# Patient Record
Sex: Female | Born: 1935 | Race: White | Hispanic: No | Smoking: Never smoker
Health system: Southern US, Community
[De-identification: ages and names within clinical notes are randomized; demographics above are authoritative.]

## PROBLEM LIST (undated history)

## (undated) DIAGNOSIS — F0390 Unspecified dementia without behavioral disturbance: Secondary | ICD-10-CM

## (undated) DIAGNOSIS — I10 Essential (primary) hypertension: Secondary | ICD-10-CM

## (undated) DIAGNOSIS — E785 Hyperlipidemia, unspecified: Secondary | ICD-10-CM

---

## 2011-09-26 DIAGNOSIS — H43399 Other vitreous opacities, unspecified eye: Secondary | ICD-10-CM | POA: Diagnosis not present

## 2011-09-26 DIAGNOSIS — H35039 Hypertensive retinopathy, unspecified eye: Secondary | ICD-10-CM | POA: Diagnosis not present

## 2011-09-26 DIAGNOSIS — H2510 Age-related nuclear cataract, unspecified eye: Secondary | ICD-10-CM | POA: Diagnosis not present

## 2012-05-10 DIAGNOSIS — M899 Disorder of bone, unspecified: Secondary | ICD-10-CM | POA: Diagnosis not present

## 2012-05-10 DIAGNOSIS — E785 Hyperlipidemia, unspecified: Secondary | ICD-10-CM | POA: Diagnosis not present

## 2012-05-10 DIAGNOSIS — I10 Essential (primary) hypertension: Secondary | ICD-10-CM | POA: Diagnosis not present

## 2012-05-10 DIAGNOSIS — R82998 Other abnormal findings in urine: Secondary | ICD-10-CM | POA: Diagnosis not present

## 2012-05-10 DIAGNOSIS — M949 Disorder of cartilage, unspecified: Secondary | ICD-10-CM | POA: Diagnosis not present

## 2012-05-17 DIAGNOSIS — Z1331 Encounter for screening for depression: Secondary | ICD-10-CM | POA: Diagnosis not present

## 2012-05-17 DIAGNOSIS — R42 Dizziness and giddiness: Secondary | ICD-10-CM | POA: Diagnosis not present

## 2012-05-17 DIAGNOSIS — M899 Disorder of bone, unspecified: Secondary | ICD-10-CM | POA: Diagnosis not present

## 2012-05-17 DIAGNOSIS — M949 Disorder of cartilage, unspecified: Secondary | ICD-10-CM | POA: Diagnosis not present

## 2012-05-17 DIAGNOSIS — Z0000 Encounter for general adult medical examination without abnormal findings: Secondary | ICD-10-CM | POA: Diagnosis not present

## 2012-05-17 DIAGNOSIS — E785 Hyperlipidemia, unspecified: Secondary | ICD-10-CM | POA: Diagnosis not present

## 2012-05-17 DIAGNOSIS — I10 Essential (primary) hypertension: Secondary | ICD-10-CM | POA: Diagnosis not present

## 2013-05-15 DIAGNOSIS — R82998 Other abnormal findings in urine: Secondary | ICD-10-CM | POA: Diagnosis not present

## 2013-05-15 DIAGNOSIS — E785 Hyperlipidemia, unspecified: Secondary | ICD-10-CM | POA: Diagnosis not present

## 2013-05-15 DIAGNOSIS — I10 Essential (primary) hypertension: Secondary | ICD-10-CM | POA: Diagnosis not present

## 2013-05-15 DIAGNOSIS — M899 Disorder of bone, unspecified: Secondary | ICD-10-CM | POA: Diagnosis not present

## 2013-05-22 DIAGNOSIS — Z0000 Encounter for general adult medical examination without abnormal findings: Secondary | ICD-10-CM | POA: Diagnosis not present

## 2013-05-22 DIAGNOSIS — I10 Essential (primary) hypertension: Secondary | ICD-10-CM | POA: Diagnosis not present

## 2013-05-22 DIAGNOSIS — H04129 Dry eye syndrome of unspecified lacrimal gland: Secondary | ICD-10-CM | POA: Diagnosis not present

## 2013-05-22 DIAGNOSIS — Z1331 Encounter for screening for depression: Secondary | ICD-10-CM | POA: Diagnosis not present

## 2013-05-22 DIAGNOSIS — E785 Hyperlipidemia, unspecified: Secondary | ICD-10-CM | POA: Diagnosis not present

## 2013-05-22 DIAGNOSIS — M899 Disorder of bone, unspecified: Secondary | ICD-10-CM | POA: Diagnosis not present

## 2013-05-22 DIAGNOSIS — IMO0002 Reserved for concepts with insufficient information to code with codable children: Secondary | ICD-10-CM | POA: Diagnosis not present

## 2013-05-22 DIAGNOSIS — Z79899 Other long term (current) drug therapy: Secondary | ICD-10-CM | POA: Diagnosis not present

## 2014-02-26 DIAGNOSIS — H2510 Age-related nuclear cataract, unspecified eye: Secondary | ICD-10-CM | POA: Diagnosis not present

## 2014-02-26 DIAGNOSIS — H524 Presbyopia: Secondary | ICD-10-CM | POA: Diagnosis not present

## 2014-02-26 DIAGNOSIS — H40039 Anatomical narrow angle, unspecified eye: Secondary | ICD-10-CM | POA: Diagnosis not present

## 2014-02-26 DIAGNOSIS — H25019 Cortical age-related cataract, unspecified eye: Secondary | ICD-10-CM | POA: Diagnosis not present

## 2014-04-09 DIAGNOSIS — H35379 Puckering of macula, unspecified eye: Secondary | ICD-10-CM | POA: Diagnosis not present

## 2014-04-09 DIAGNOSIS — H25019 Cortical age-related cataract, unspecified eye: Secondary | ICD-10-CM | POA: Diagnosis not present

## 2014-04-09 DIAGNOSIS — H35369 Drusen (degenerative) of macula, unspecified eye: Secondary | ICD-10-CM | POA: Diagnosis not present

## 2014-04-09 DIAGNOSIS — H40039 Anatomical narrow angle, unspecified eye: Secondary | ICD-10-CM | POA: Diagnosis not present

## 2014-04-09 DIAGNOSIS — H2510 Age-related nuclear cataract, unspecified eye: Secondary | ICD-10-CM | POA: Diagnosis not present

## 2014-05-28 DIAGNOSIS — E785 Hyperlipidemia, unspecified: Secondary | ICD-10-CM | POA: Diagnosis not present

## 2014-05-28 DIAGNOSIS — M8588 Other specified disorders of bone density and structure, other site: Secondary | ICD-10-CM | POA: Diagnosis not present

## 2014-05-28 DIAGNOSIS — M8580 Other specified disorders of bone density and structure, unspecified site: Secondary | ICD-10-CM | POA: Diagnosis not present

## 2014-05-28 DIAGNOSIS — R8290 Unspecified abnormal findings in urine: Secondary | ICD-10-CM | POA: Diagnosis not present

## 2014-05-28 DIAGNOSIS — M859 Disorder of bone density and structure, unspecified: Secondary | ICD-10-CM | POA: Diagnosis not present

## 2014-05-28 DIAGNOSIS — I10 Essential (primary) hypertension: Secondary | ICD-10-CM | POA: Diagnosis not present

## 2014-05-28 DIAGNOSIS — R8299 Other abnormal findings in urine: Secondary | ICD-10-CM | POA: Diagnosis not present

## 2014-06-03 DIAGNOSIS — Z1212 Encounter for screening for malignant neoplasm of rectum: Secondary | ICD-10-CM | POA: Diagnosis not present

## 2014-06-04 DIAGNOSIS — Z6824 Body mass index (BMI) 24.0-24.9, adult: Secondary | ICD-10-CM | POA: Diagnosis not present

## 2014-06-04 DIAGNOSIS — I10 Essential (primary) hypertension: Secondary | ICD-10-CM | POA: Diagnosis not present

## 2014-06-04 DIAGNOSIS — Z1389 Encounter for screening for other disorder: Secondary | ICD-10-CM | POA: Diagnosis not present

## 2014-06-04 DIAGNOSIS — H04129 Dry eye syndrome of unspecified lacrimal gland: Secondary | ICD-10-CM | POA: Diagnosis not present

## 2014-06-04 DIAGNOSIS — M8580 Other specified disorders of bone density and structure, unspecified site: Secondary | ICD-10-CM | POA: Diagnosis not present

## 2014-06-04 DIAGNOSIS — Z0000 Encounter for general adult medical examination without abnormal findings: Secondary | ICD-10-CM | POA: Diagnosis not present

## 2014-06-04 DIAGNOSIS — E785 Hyperlipidemia, unspecified: Secondary | ICD-10-CM | POA: Diagnosis not present

## 2014-07-01 DIAGNOSIS — H2512 Age-related nuclear cataract, left eye: Secondary | ICD-10-CM | POA: Diagnosis not present

## 2014-07-29 DIAGNOSIS — H25011 Cortical age-related cataract, right eye: Secondary | ICD-10-CM | POA: Diagnosis not present

## 2014-07-29 DIAGNOSIS — Z961 Presence of intraocular lens: Secondary | ICD-10-CM | POA: Diagnosis not present

## 2014-07-29 DIAGNOSIS — H2511 Age-related nuclear cataract, right eye: Secondary | ICD-10-CM | POA: Diagnosis not present

## 2014-08-05 DIAGNOSIS — H2511 Age-related nuclear cataract, right eye: Secondary | ICD-10-CM | POA: Diagnosis not present

## 2014-10-16 DIAGNOSIS — H40033 Anatomical narrow angle, bilateral: Secondary | ICD-10-CM | POA: Diagnosis not present

## 2014-11-03 DIAGNOSIS — H40033 Anatomical narrow angle, bilateral: Secondary | ICD-10-CM | POA: Diagnosis not present

## 2015-01-22 DIAGNOSIS — H40033 Anatomical narrow angle, bilateral: Secondary | ICD-10-CM | POA: Diagnosis not present

## 2015-03-13 DIAGNOSIS — H40033 Anatomical narrow angle, bilateral: Secondary | ICD-10-CM | POA: Diagnosis not present

## 2015-03-13 DIAGNOSIS — H01003 Unspecified blepharitis right eye, unspecified eyelid: Secondary | ICD-10-CM | POA: Diagnosis not present

## 2015-03-13 DIAGNOSIS — H35362 Drusen (degenerative) of macula, left eye: Secondary | ICD-10-CM | POA: Diagnosis not present

## 2015-03-13 DIAGNOSIS — H1851 Endothelial corneal dystrophy: Secondary | ICD-10-CM | POA: Diagnosis not present

## 2015-06-03 DIAGNOSIS — D649 Anemia, unspecified: Secondary | ICD-10-CM | POA: Diagnosis not present

## 2015-06-03 DIAGNOSIS — E785 Hyperlipidemia, unspecified: Secondary | ICD-10-CM | POA: Diagnosis not present

## 2015-06-03 DIAGNOSIS — I10 Essential (primary) hypertension: Secondary | ICD-10-CM | POA: Diagnosis not present

## 2015-06-03 DIAGNOSIS — Z79899 Other long term (current) drug therapy: Secondary | ICD-10-CM | POA: Diagnosis not present

## 2015-06-03 DIAGNOSIS — M859 Disorder of bone density and structure, unspecified: Secondary | ICD-10-CM | POA: Diagnosis not present

## 2015-06-10 DIAGNOSIS — H04129 Dry eye syndrome of unspecified lacrimal gland: Secondary | ICD-10-CM | POA: Diagnosis not present

## 2015-06-10 DIAGNOSIS — Z1389 Encounter for screening for other disorder: Secondary | ICD-10-CM | POA: Diagnosis not present

## 2015-06-10 DIAGNOSIS — R739 Hyperglycemia, unspecified: Secondary | ICD-10-CM | POA: Diagnosis not present

## 2015-06-10 DIAGNOSIS — E785 Hyperlipidemia, unspecified: Secondary | ICD-10-CM | POA: Diagnosis not present

## 2015-06-10 DIAGNOSIS — R42 Dizziness and giddiness: Secondary | ICD-10-CM | POA: Diagnosis not present

## 2015-06-10 DIAGNOSIS — Z6824 Body mass index (BMI) 24.0-24.9, adult: Secondary | ICD-10-CM | POA: Diagnosis not present

## 2015-06-10 DIAGNOSIS — I10 Essential (primary) hypertension: Secondary | ICD-10-CM | POA: Diagnosis not present

## 2015-06-10 DIAGNOSIS — M859 Disorder of bone density and structure, unspecified: Secondary | ICD-10-CM | POA: Diagnosis not present

## 2015-06-10 DIAGNOSIS — Z0000 Encounter for general adult medical examination without abnormal findings: Secondary | ICD-10-CM | POA: Diagnosis not present

## 2015-06-10 DIAGNOSIS — D649 Anemia, unspecified: Secondary | ICD-10-CM | POA: Diagnosis not present

## 2016-06-09 DIAGNOSIS — I10 Essential (primary) hypertension: Secondary | ICD-10-CM | POA: Diagnosis not present

## 2016-06-09 DIAGNOSIS — M859 Disorder of bone density and structure, unspecified: Secondary | ICD-10-CM | POA: Diagnosis not present

## 2016-06-09 DIAGNOSIS — E784 Other hyperlipidemia: Secondary | ICD-10-CM | POA: Diagnosis not present

## 2016-06-16 DIAGNOSIS — E784 Other hyperlipidemia: Secondary | ICD-10-CM | POA: Diagnosis not present

## 2016-06-16 DIAGNOSIS — R634 Abnormal weight loss: Secondary | ICD-10-CM | POA: Diagnosis not present

## 2016-06-16 DIAGNOSIS — I10 Essential (primary) hypertension: Secondary | ICD-10-CM | POA: Diagnosis not present

## 2016-06-16 DIAGNOSIS — M545 Low back pain: Secondary | ICD-10-CM | POA: Diagnosis not present

## 2016-06-16 DIAGNOSIS — N183 Chronic kidney disease, stage 3 (moderate): Secondary | ICD-10-CM | POA: Diagnosis not present

## 2016-06-16 DIAGNOSIS — Z6823 Body mass index (BMI) 23.0-23.9, adult: Secondary | ICD-10-CM | POA: Diagnosis not present

## 2016-06-16 DIAGNOSIS — M810 Age-related osteoporosis without current pathological fracture: Secondary | ICD-10-CM | POA: Diagnosis not present

## 2016-06-16 DIAGNOSIS — Z1389 Encounter for screening for other disorder: Secondary | ICD-10-CM | POA: Diagnosis not present

## 2016-06-16 DIAGNOSIS — Z0000 Encounter for general adult medical examination without abnormal findings: Secondary | ICD-10-CM | POA: Diagnosis not present

## 2017-06-12 DIAGNOSIS — M810 Age-related osteoporosis without current pathological fracture: Secondary | ICD-10-CM | POA: Diagnosis not present

## 2017-06-12 DIAGNOSIS — I10 Essential (primary) hypertension: Secondary | ICD-10-CM | POA: Diagnosis not present

## 2017-06-12 DIAGNOSIS — E7849 Other hyperlipidemia: Secondary | ICD-10-CM | POA: Diagnosis not present

## 2017-06-19 DIAGNOSIS — Z0000 Encounter for general adult medical examination without abnormal findings: Secondary | ICD-10-CM | POA: Diagnosis not present

## 2017-06-19 DIAGNOSIS — E7849 Other hyperlipidemia: Secondary | ICD-10-CM | POA: Diagnosis not present

## 2017-06-19 DIAGNOSIS — N183 Chronic kidney disease, stage 3 (moderate): Secondary | ICD-10-CM | POA: Diagnosis not present

## 2017-06-19 DIAGNOSIS — Z6822 Body mass index (BMI) 22.0-22.9, adult: Secondary | ICD-10-CM | POA: Diagnosis not present

## 2017-06-19 DIAGNOSIS — Z1389 Encounter for screening for other disorder: Secondary | ICD-10-CM | POA: Diagnosis not present

## 2017-06-19 DIAGNOSIS — M810 Age-related osteoporosis without current pathological fracture: Secondary | ICD-10-CM | POA: Diagnosis not present

## 2017-06-19 DIAGNOSIS — I10 Essential (primary) hypertension: Secondary | ICD-10-CM | POA: Diagnosis not present

## 2017-06-19 DIAGNOSIS — R7309 Other abnormal glucose: Secondary | ICD-10-CM | POA: Diagnosis not present

## 2017-06-19 DIAGNOSIS — R634 Abnormal weight loss: Secondary | ICD-10-CM | POA: Diagnosis not present

## 2018-05-26 ENCOUNTER — Encounter (HOSPITAL_COMMUNITY): Payer: Self-pay

## 2018-05-26 ENCOUNTER — Observation Stay (HOSPITAL_COMMUNITY): Payer: Medicare Other

## 2018-05-26 ENCOUNTER — Other Ambulatory Visit: Payer: Self-pay

## 2018-05-26 ENCOUNTER — Emergency Department (HOSPITAL_COMMUNITY): Payer: Medicare Other

## 2018-05-26 ENCOUNTER — Observation Stay (HOSPITAL_COMMUNITY)
Admission: EM | Admit: 2018-05-26 | Discharge: 2018-05-28 | Disposition: A | Discharge status: Home / Self Care | Payer: Medicare Other | Attending: Internal Medicine | Admitting: Internal Medicine

## 2018-05-26 DIAGNOSIS — M25511 Pain in right shoulder: Secondary | ICD-10-CM | POA: Insufficient documentation

## 2018-05-26 DIAGNOSIS — T796XXA Traumatic ischemia of muscle, initial encounter: Secondary | ICD-10-CM | POA: Diagnosis not present

## 2018-05-26 DIAGNOSIS — Z91018 Allergy to other foods: Secondary | ICD-10-CM | POA: Diagnosis not present

## 2018-05-26 DIAGNOSIS — Y9389 Activity, other specified: Secondary | ICD-10-CM | POA: Diagnosis not present

## 2018-05-26 DIAGNOSIS — S0003XA Contusion of scalp, initial encounter: Secondary | ICD-10-CM | POA: Diagnosis not present

## 2018-05-26 DIAGNOSIS — Z79899 Other long term (current) drug therapy: Secondary | ICD-10-CM | POA: Diagnosis not present

## 2018-05-26 DIAGNOSIS — N390 Urinary tract infection, site not specified: Secondary | ICD-10-CM | POA: Insufficient documentation

## 2018-05-26 DIAGNOSIS — M549 Dorsalgia, unspecified: Secondary | ICD-10-CM | POA: Diagnosis not present

## 2018-05-26 DIAGNOSIS — S8991XA Unspecified injury of right lower leg, initial encounter: Secondary | ICD-10-CM | POA: Insufficient documentation

## 2018-05-26 DIAGNOSIS — R51 Headache: Secondary | ICD-10-CM | POA: Diagnosis not present

## 2018-05-26 DIAGNOSIS — M8580 Other specified disorders of bone density and structure, unspecified site: Secondary | ICD-10-CM | POA: Diagnosis not present

## 2018-05-26 DIAGNOSIS — M79601 Pain in right arm: Secondary | ICD-10-CM | POA: Diagnosis not present

## 2018-05-26 DIAGNOSIS — M19011 Primary osteoarthritis, right shoulder: Secondary | ICD-10-CM | POA: Insufficient documentation

## 2018-05-26 DIAGNOSIS — E785 Hyperlipidemia, unspecified: Secondary | ICD-10-CM | POA: Diagnosis not present

## 2018-05-26 DIAGNOSIS — W19XXXA Unspecified fall, initial encounter: Secondary | ICD-10-CM | POA: Diagnosis not present

## 2018-05-26 DIAGNOSIS — I959 Hypotension, unspecified: Secondary | ICD-10-CM | POA: Diagnosis not present

## 2018-05-26 DIAGNOSIS — N179 Acute kidney failure, unspecified: Secondary | ICD-10-CM | POA: Insufficient documentation

## 2018-05-26 DIAGNOSIS — G3184 Mild cognitive impairment, so stated: Secondary | ICD-10-CM | POA: Diagnosis present

## 2018-05-26 DIAGNOSIS — I10 Essential (primary) hypertension: Secondary | ICD-10-CM | POA: Diagnosis not present

## 2018-05-26 DIAGNOSIS — I491 Atrial premature depolarization: Secondary | ICD-10-CM | POA: Insufficient documentation

## 2018-05-26 DIAGNOSIS — F0390 Unspecified dementia without behavioral disturbance: Secondary | ICD-10-CM | POA: Diagnosis not present

## 2018-05-26 DIAGNOSIS — S8992XA Unspecified injury of left lower leg, initial encounter: Secondary | ICD-10-CM | POA: Diagnosis not present

## 2018-05-26 DIAGNOSIS — R748 Abnormal levels of other serum enzymes: Secondary | ICD-10-CM | POA: Diagnosis not present

## 2018-05-26 DIAGNOSIS — S199XXA Unspecified injury of neck, initial encounter: Secondary | ICD-10-CM | POA: Diagnosis not present

## 2018-05-26 DIAGNOSIS — N289 Disorder of kidney and ureter, unspecified: Secondary | ICD-10-CM

## 2018-05-26 DIAGNOSIS — G8929 Other chronic pain: Secondary | ICD-10-CM | POA: Diagnosis not present

## 2018-05-26 DIAGNOSIS — Y92002 Bathroom of unspecified non-institutional (private) residence single-family (private) house as the place of occurrence of the external cause: Secondary | ICD-10-CM | POA: Diagnosis not present

## 2018-05-26 DIAGNOSIS — W1839XA Other fall on same level, initial encounter: Secondary | ICD-10-CM | POA: Diagnosis not present

## 2018-05-26 DIAGNOSIS — S79912A Unspecified injury of left hip, initial encounter: Secondary | ICD-10-CM | POA: Diagnosis not present

## 2018-05-26 DIAGNOSIS — M25552 Pain in left hip: Secondary | ICD-10-CM | POA: Diagnosis not present

## 2018-05-26 DIAGNOSIS — S4991XA Unspecified injury of right shoulder and upper arm, initial encounter: Secondary | ICD-10-CM | POA: Diagnosis not present

## 2018-05-26 DIAGNOSIS — S0990XA Unspecified injury of head, initial encounter: Secondary | ICD-10-CM | POA: Diagnosis not present

## 2018-05-26 DIAGNOSIS — M6282 Rhabdomyolysis: Secondary | ICD-10-CM | POA: Diagnosis present

## 2018-05-26 HISTORY — DX: Hyperlipidemia, unspecified: E78.5

## 2018-05-26 HISTORY — DX: Essential (primary) hypertension: I10

## 2018-05-26 LAB — URINALYSIS, ROUTINE W REFLEX MICROSCOPIC
Bilirubin Urine: NEGATIVE
Glucose, UA: NEGATIVE mg/dL
Ketones, ur: NEGATIVE mg/dL
Nitrite: NEGATIVE
Protein, ur: 30 mg/dL — AB
Specific Gravity, Urine: 1.018 (ref 1.005–1.030)
WBC, UA: >50 WBC/hpf — ABNORMAL HIGH (ref 0–5)
pH: 5 (ref 5.0–8.0)

## 2018-05-26 LAB — CBC WITH DIFFERENTIAL/PLATELET
Abs Immature Granulocytes: 0.03 10*3/uL (ref 0.00–0.07)
Basophils Absolute: 0 10*3/uL (ref 0.0–0.1)
Basophils Relative: 0 %
Eosinophils Absolute: 0 10*3/uL (ref 0.0–0.5)
Eosinophils Relative: 0 %
HCT: 36.9 % (ref 36.0–46.0)
Hemoglobin: 11.5 g/dL — ABNORMAL LOW (ref 12.0–15.0)
Immature Granulocytes: 0 %
Lymphocytes Relative: 8 %
Lymphs Abs: 0.8 10*3/uL (ref 0.7–4.0)
MCH: 26.7 pg (ref 26.0–34.0)
MCHC: 31.2 g/dL (ref 30.0–36.0)
MCV: 85.8 fL (ref 80.0–100.0)
Monocytes Absolute: 0.7 10*3/uL (ref 0.1–1.0)
Monocytes Relative: 7 %
Neutro Abs: 8.7 10*3/uL — ABNORMAL HIGH (ref 1.7–7.7)
Neutrophils Relative %: 85 %
Platelets: 262 10*3/uL (ref 150–400)
RBC: 4.3 MIL/uL (ref 3.87–5.11)
RDW: 13.8 % (ref 11.5–15.5)
WBC: 10.3 10*3/uL (ref 4.0–10.5)
nRBC: 0 % (ref 0.0–0.2)

## 2018-05-26 LAB — BASIC METABOLIC PANEL
Anion gap: 7 (ref 5–15)
BUN: 32 mg/dL — ABNORMAL HIGH (ref 8–23)
CO2: 26 mmol/L (ref 22–32)
Calcium: 8.8 mg/dL — ABNORMAL LOW (ref 8.9–10.3)
Chloride: 104 mmol/L (ref 98–111)
Creatinine, Ser: 1.13 mg/dL — ABNORMAL HIGH (ref 0.44–1.00)
GFR calc Af Amer: 49 mL/min — ABNORMAL LOW (ref >60)
GFR calc non Af Amer: 42 mL/min — ABNORMAL LOW (ref >60)
Glucose, Bld: 172 mg/dL — ABNORMAL HIGH (ref 70–99)
Potassium: 3.6 mmol/L (ref 3.5–5.1)
Sodium: 137 mmol/L (ref 135–145)

## 2018-05-26 LAB — CK: Total CK: 1854 U/L — ABNORMAL HIGH (ref 38–234)

## 2018-05-26 LAB — I-STAT CG4 LACTIC ACID, ED: Lactic Acid, Venous: 1.47 mmol/L (ref 0.5–1.9)

## 2018-05-26 MED ORDER — MAGNESIUM HYDROXIDE 400 MG/5ML PO SUSP: 30.0000 mL | Freq: Every day | ORAL | Status: DC | PRN

## 2018-05-26 MED ORDER — LACTATED RINGERS IV SOLN
INTRAVENOUS | Status: DC
  Administered 2018-05-26 – 2018-05-27 (×2): via INTRAVENOUS

## 2018-05-26 MED ORDER — ONDANSETRON HCL 4 MG PO TABS
4.0000 mg | ORAL_TABLET | Freq: Four times a day (QID) | ORAL | Status: DC | PRN
  Administered 2018-05-26: 4 mg via ORAL
  Filled 2018-05-26: qty 1

## 2018-05-26 MED ORDER — SODIUM CHLORIDE 0.9 % IV BOLUS
500.0000 mL | Freq: Once | INTRAVENOUS | Status: AC
  Administered 2018-05-26: 500 mL via INTRAVENOUS

## 2018-05-26 MED ORDER — PRAVASTATIN SODIUM 20 MG PO TABS
20.0000 mg | ORAL_TABLET | Freq: Every day | ORAL | Status: DC
  Administered 2018-05-26 – 2018-05-27 (×2): 20 mg via ORAL
  Filled 2018-05-26 (×3): qty 1

## 2018-05-26 MED ORDER — ACETAMINOPHEN 325 MG PO TABS
650.0000 mg | ORAL_TABLET | Freq: Four times a day (QID) | ORAL | Status: DC | PRN
  Administered 2018-05-27: 650 mg via ORAL
  Filled 2018-05-26: qty 2

## 2018-05-26 MED ORDER — HEPARIN SODIUM (PORCINE) 5000 UNIT/ML IJ SOLN
5000.0000 [IU] | Freq: Three times a day (TID) | INTRAMUSCULAR | Status: DC
  Administered 2018-05-26 – 2018-05-28 (×6): 5000 [IU] via SUBCUTANEOUS
  Filled 2018-05-26 (×6): qty 1

## 2018-05-26 NOTE — ED Notes (Signed)
Patient transported to X-ray 

## 2018-05-26 NOTE — ED Triage Notes (Signed)
Pt presents for evaluation of injuries after fall yesterday. Pt was last spoken to by daughter around 4 PM yesterday. Pt lives alone. Daughter came to house this morning and was noted to be in bathtub. Pt has some pressure injuries to R shoulder and R elbow, red but blanchable. Pt is AxO x3. Has hematoma to head, no blood thinners.

## 2018-05-26 NOTE — Progress Notes (Signed)
Patricia Lynch [id number] Admission Data: 05/26/2018 6:48 PM Attending Provider: Sharlene Lynch,*  [id number], Patricia Boom, MD    Patricia Lynch is a 84 y.o. female patient admitted from ED awake, alert  & orientated  X 3,  Full Code, VSS - Blood pressure (!) 126/57, pulse 72, resp. rate 15, SpO2 100 %., O2  On RA, no c/o shortness of breath, no c/o chest pain, no distress noted.   IV site WDL:  forearm left, condition patent and no redness with a transparent dsg that's clean dry and intact.  Allergies:   Allergies  Allergen Reactions  . Beef-Derived Products   . Other     Some vegetables     Past Medical History:  Diagnosis Date  . Hyperlipidemia   . Hypertension     History:  obtained from the patient. Tobacco/alcohol: denied none  Pt orientation to unit, room and routine. Information packet given to patient/family and safety video watched.  Admission INP armband ID verified with patient/family, and in place. SR up x 3, fall risk assessment complete with Patient and family verbalizing understanding of risks associated with falls. Pt verbalizes an understanding of how to use the call bell and to call for help before getting out of bed.    Ecchymoses noted - shoulder(s) bilateral, elbow(s) bilateral, back. Otherwise negative skin exam.   Will cont to monitor and assist as needed.  Patricia Pulley, RN 05/26/2018 6:48 PM

## 2018-05-26 NOTE — ED Provider Notes (Signed)
[facility] EMERGENCY DEPARTMENT Provider Note   CSN: [id number] Arrival date & time: 05/26/18  1048     History   Chief Complaint Chief Complaint  Patient presents with  . Fall    HPI Patricia Lynch is a 84 y.o. female.  HPI Patient presents to the emergency department with injuries after a fall.  The patient lives at home alone with her daughter checking on her.  The patient had a fall at some point last night and the daughter thinks it was around 4 PM.  Patient is not able to give me any history surrounding these events.  She will tell me she does have some pain in her head.  Patient states that she does not have any other pain.  The daughter went to check on her today and found her laying in the bathtub.  The daughter states she has had no complaints recently with anything. Past Medical History:  Diagnosis Date  . Hyperlipidemia   . Hypertension     There are no active problems to display for this patient.   History reviewed. No pertinent surgical history.   OB History   None      Home Medications    Prior to Admission medications   Medication Sig Start Date End Date Taking? Authorizing Provider  losartan (COZAAR) 50 MG tablet Take 50 mg by mouth at bedtime. 04/14/18  Yes [provider]  lovastatin (MEVACOR) 10 MG tablet Take 10 mg by mouth daily. 04/14/18  Yes [provider]    Family History No family history on file.  Social History Social History   Tobacco Use  . Smoking status: Not on file  Substance Use Topics  . Alcohol use: Not on file  . Drug use: Not on file     Allergies   Beef-derived products and Other   Review of Systems Review of Systems Level 5 caveat applies due to altered mental status  Physical Exam Updated Vital Signs BP (!) 120/56   Pulse 69   Resp 15   SpO2 98%   Physical Exam  Constitutional: She appears well-developed and well-nourished. No distress.  HENT:  Head: Normocephalic and  atraumatic.  Mouth/Throat: Oropharynx is clear and moist.  Eyes: Pupils are equal, round, and reactive to light.  Neck: Normal range of motion. Neck supple.  Cardiovascular: Normal rate, regular rhythm and normal heart sounds. Exam reveals no gallop and no friction rub.  No murmur heard. Pulmonary/Chest: Effort normal and breath sounds normal. No respiratory distress. She has no wheezes.  Abdominal: Soft. Bowel sounds are normal. She exhibits no distension. There is no tenderness.  Neurological: She is alert. She exhibits normal muscle tone. Coordination normal.  Skin: Skin is warm and dry. Capillary refill takes less than 2 seconds. No rash noted. No erythema.  Psychiatric: She has a normal mood and affect. Her behavior is normal.  Nursing note and vitals reviewed.    ED Treatments / Results  Labs (all labs ordered are listed, but only abnormal results are displayed) Labs Reviewed  BASIC METABOLIC PANEL - Abnormal; Notable for the following components:      Result Value   Glucose, Bld 172 (*)    BUN 32 (*)    Creatinine, Ser 1.13 (*)    Calcium 8.8 (*)    GFR calc non Af Amer 42 (*)    GFR calc Af Amer 49 (*)    All other components within normal limits  CBC WITH DIFFERENTIAL/PLATELET -  Abnormal; Notable for the following components:   Hemoglobin 11.5 (*)    Neutro Abs 8.7 (*)    All other components within normal limits  CK - Abnormal; Notable for the following components:   Total CK 1,854 (*)    All other components within normal limits  URINALYSIS, ROUTINE W REFLEX MICROSCOPIC  I-STAT CG4 LACTIC ACID, ED    EKG None  Radiology Ct Head Wo Contrast  Result Date: 05/26/2018 CLINICAL DATA:  Patient status post fall yesterday with a blow to the head. Initial encounter. EXAM: CT HEAD WITHOUT CONTRAST CT CERVICAL SPINE WITHOUT CONTRAST TECHNIQUE: Multidetector CT imaging of the head and cervical spine was performed following the standard protocol without intravenous  contrast. Multiplanar CT image reconstructions of the cervical spine were also generated. COMPARISON:  None. FINDINGS: CT HEAD FINDINGS Brain: No evidence of acute infarction, hemorrhage, hydrocephalus, extra-axial collection or mass lesion/mass effect. Vascular: No hyperdense vessel or unexpected calcification. Skull: Normal. Negative for fracture or focal lesion. Sinuses/Orbits: Status post lens extraction.  Otherwise negative. Other: Scalp hematoma posteriorly noted. CT CERVICAL SPINE FINDINGS Alignment: Maintained. Skull base and vertebrae: No acute fracture. No primary bone lesion or focal pathologic process. Soft tissues and spinal canal: No prevertebral fluid or swelling. No visible canal hematoma. Disc levels: The patient has multilevel loss of disc space height with endplate spurring. Central disc protrusion at C3-4 is noted. Upper chest: There is some apical scar.  Otherwise negative. Other: None. IMPRESSION: Scalp hematoma without underlying fracture or intracranial abnormality. No acute abnormality cervical spine. Cervical spondylosis. Electronically Signed   By: Drusilla Kanner M.D.   On: 05/26/2018 13:02   Ct Cervical Spine Wo Contrast  Result Date: 05/26/2018 CLINICAL DATA:  Patient status post fall yesterday with a blow to the head. Initial encounter. EXAM: CT HEAD WITHOUT CONTRAST CT CERVICAL SPINE WITHOUT CONTRAST TECHNIQUE: Multidetector CT imaging of the head and cervical spine was performed following the standard protocol without intravenous contrast. Multiplanar CT image reconstructions of the cervical spine were also generated. COMPARISON:  None. FINDINGS: CT HEAD FINDINGS Brain: No evidence of acute infarction, hemorrhage, hydrocephalus, extra-axial collection or mass lesion/mass effect. Vascular: No hyperdense vessel or unexpected calcification. Skull: Normal. Negative for fracture or focal lesion. Sinuses/Orbits: Status post lens extraction.  Otherwise negative. Other: Scalp hematoma  posteriorly noted. CT CERVICAL SPINE FINDINGS Alignment: Maintained. Skull base and vertebrae: No acute fracture. No primary bone lesion or focal pathologic process. Soft tissues and spinal canal: No prevertebral fluid or swelling. No visible canal hematoma. Disc levels: The patient has multilevel loss of disc space height with endplate spurring. Central disc protrusion at C3-4 is noted. Upper chest: There is some apical scar.  Otherwise negative. Other: None. IMPRESSION: Scalp hematoma without underlying fracture or intracranial abnormality. No acute abnormality cervical spine. Cervical spondylosis. Electronically Signed   By: Drusilla Kanner M.D.   On: 05/26/2018 13:02   Dg Hip Unilat W Or Wo Pelvis 2-3 Views Left  Result Date: 05/26/2018 CLINICAL DATA:  Fall, injury, pain EXAM: DG HIP (WITH OR WITHOUT PELVIS) 2-3V LEFT COMPARISON:  None. FINDINGS: Bones are osteopenic. Degenerative changes of the lumbosacral spine, SI joints and right hip. Bony pelvis appears intact. Left hip views demonstrate no displaced fracture or malalignment. Normal bowel gas pattern. IMPRESSION: Degenerative changes and osteopenia. No acute finding by plain radiography. Electronically Signed   By: Judie Petit.  Shick M.D.   On: 05/26/2018 12:46    Procedures Procedures (including critical care time)  Medications Ordered  in ED Medications  sodium chloride 0.9 % bolus 500 mL (500 mLs Intravenous New Bag/Given 05/26/18 1422)     Initial Impression / Assessment and Plan / ED Course  I have reviewed the triage vital signs and the nursing notes.  Pertinent labs & imaging results that were available during my care of the patient were reviewed by me and considered in my medical decision making (see chart for details).     Patient has been stable here in the emergency department does have an elevated CK level and kidney function.  I spoke with the [facility] about admitting the patient for further evaluation and monitoring of  her mental status to assess if this is a new finding for her or something more chronic and to reevaluate her kidney function and CK levels.  Final Clinical Impressions(s) / ED Diagnoses   Final diagnoses:  Fall, initial encounter  Elevated CK  AKI (acute kidney injury) [facility])    ED Discharge Orders    None       Charlestine Night, PA-C 05/26/18 1453    Shaune Pollack, MD 05/27/18 [phone]

## 2018-05-26 NOTE — ED Notes (Signed)
Pt back from imaging

## 2018-05-26 NOTE — H&P (Addendum)
History and Physical    Patricia Lynch VWU:[id number] DOB: 05-Mar-1929 DOA: 05/26/2018  PCP: Jarome Matin, MD  Patient coming from: Home, with daughter who helps with the hx  Chief Complaint: Unwitnessed fall  HPI: Patricia Lynch is a 84 y.o. female with medical history significant of hyperlipidemia, hypertension, and dementia.  Patient lives at home by herself.  Last night, she spoke with her daughter around 5:30 PM.  She normally bathes around 7 PM.  Her daughter was unable to get a hold of her until 9:30 in the morning.  Her daughter believes she was down for around 14 hours.  The patient reports continued chronic back pain, new right arm pain, and new headache.  She denies any fevers, diffuse muscle aches, or decreased urination.  She does not remember being down or falling. Daughter states she is around her baseline mental status.   ED Course: Patient placed on gentle fluids.  We do not have a baseline GFR/creatinine.  CK was elevated mildly at 1854.  Imaging unremarkable, of note there was no plain film of the right upper extremity.  Review of Systems: As per HPI otherwise 10 point review of systems negative.   Past Medical History:  Diagnosis Date  . Hyperlipidemia   . Hypertension     History reviewed. No pertinent surgical history.  Nonsmoker No illicit drug use  Allergies  Allergen Reactions  . Beef-Derived Products   . Other     Some vegetables   Famhx non pertinent  Prior to Admission medications   Medication Sig Start Date End Date Taking? Authorizing Provider  losartan (COZAAR) 50 MG tablet Take 50 mg by mouth at bedtime. 04/14/18  Yes [provider]  lovastatin (MEVACOR) 10 MG tablet Take 10 mg by mouth daily. 04/14/18  Yes [provider]    Physical Exam: Vitals:   05/26/18 1430 05/26/18 1445 05/26/18 1500 05/26/18 1515  BP: (!) 127/55 (!) 126/54 (!) 120/54 (!) 126/57  Pulse: 67 70 70 72  Resp: 15 17 19 15   TempSrc:      SpO2: 99% 100% 100%  100%     Constitutional: NAD, calm, comfortable Eyes: PERRL, lids and conjunctivae normal ENMT: Mucous membranes are moist. Posterior pharynx clear of any exudate or lesions.Normal dentition.  Neck: normal, supple, no masses, no thyromegaly Respiratory: clear to auscultation bilaterally, no wheezing, no crackles. Normal respiratory effort. No accessory muscle use.  Cardiovascular: Regular rate and rhythm, no murmurs / rubs / gallops. No extremity edema. 2+ pedal pulses. No carotid bruits.  Abdomen: no tenderness, no masses palpated. No hepatosplenomegaly. Bowel sounds positive.  Musculoskeletal: no clubbing / cyanosis. No joint deformity upper and lower extremities. +erythema and ttp over the R shoulder at acromion laterally. Good ROM, no contractures. Normal muscle tone.  Skin: No ulcers. No induration. Excoriations noted on face.  Neurologic: CN 2-12 grossly intact. Sensation intact, no cerebellar signs. Strength 5/5 in all 4.  Psychiatric: Limited judgment and insight. Alert and oriented x 3, did not know president. Normal mood.   Labs on Admission: I have personally reviewed following labs and imaging studies  CBC: Recent Labs  Lab 05/26/18 1151  WBC 10.3  NEUTROABS 8.7*  HGB 11.5*  HCT 36.9  MCV 85.8  PLT 262   Basic Metabolic Panel: Recent Labs  Lab 05/26/18 1151  NA 137  K 3.6  CL 104  CO2 26  GLUCOSE 172*  BUN 32*  CREATININE 1.13*  CALCIUM 8.8*  Cardiac Enzymes: Recent Labs  Lab 05/26/18 1151  CKTOTAL 1,854*   Radiological Exams on Admission: Ct Head Wo Contrast  Result Date: 05/26/2018 CLINICAL DATA:  Patient status post fall yesterday with a blow to the head. Initial encounter. EXAM: CT HEAD WITHOUT CONTRAST CT CERVICAL SPINE WITHOUT CONTRAST TECHNIQUE: Multidetector CT imaging of the head and cervical spine was performed following the standard protocol without intravenous contrast. Multiplanar CT image reconstructions of the cervical spine were also  generated. COMPARISON:  None. FINDINGS: CT HEAD FINDINGS Brain: No evidence of acute infarction, hemorrhage, hydrocephalus, extra-axial collection or mass lesion/mass effect. Vascular: No hyperdense vessel or unexpected calcification. Skull: Normal. Negative for fracture or focal lesion. Sinuses/Orbits: Status post lens extraction.  Otherwise negative. Other: Scalp hematoma posteriorly noted. CT CERVICAL SPINE FINDINGS Alignment: Maintained. Skull base and vertebrae: No acute fracture. No primary bone lesion or focal pathologic process. Soft tissues and spinal canal: No prevertebral fluid or swelling. No visible canal hematoma. Disc levels: The patient has multilevel loss of disc space height with endplate spurring. Central disc protrusion at C3-4 is noted. Upper chest: There is some apical scar.  Otherwise negative. Other: None. IMPRESSION: Scalp hematoma without underlying fracture or intracranial abnormality. No acute abnormality cervical spine. Cervical spondylosis. Electronically Signed   By: Drusilla Kanner M.D.   On: 05/26/2018 13:02   Ct Cervical Spine Wo Contrast  Result Date: 05/26/2018 CLINICAL DATA:  Patient status post fall yesterday with a blow to the head. Initial encounter. EXAM: CT HEAD WITHOUT CONTRAST CT CERVICAL SPINE WITHOUT CONTRAST TECHNIQUE: Multidetector CT imaging of the head and cervical spine was performed following the standard protocol without intravenous contrast. Multiplanar CT image reconstructions of the cervical spine were also generated. COMPARISON:  None. FINDINGS: CT HEAD FINDINGS Brain: No evidence of acute infarction, hemorrhage, hydrocephalus, extra-axial collection or mass lesion/mass effect. Vascular: No hyperdense vessel or unexpected calcification. Skull: Normal. Negative for fracture or focal lesion. Sinuses/Orbits: Status post lens extraction.  Otherwise negative. Other: Scalp hematoma posteriorly noted. CT CERVICAL SPINE FINDINGS Alignment: Maintained. Skull base  and vertebrae: No acute fracture. No primary bone lesion or focal pathologic process. Soft tissues and spinal canal: No prevertebral fluid or swelling. No visible canal hematoma. Disc levels: The patient has multilevel loss of disc space height with endplate spurring. Central disc protrusion at C3-4 is noted. Upper chest: There is some apical scar.  Otherwise negative. Other: None. IMPRESSION: Scalp hematoma without underlying fracture or intracranial abnormality. No acute abnormality cervical spine. Cervical spondylosis. Electronically Signed   By: Drusilla Kanner M.D.   On: 05/26/2018 13:02   Dg Hip Unilat W Or Wo Pelvis 2-3 Views Left  Result Date: 05/26/2018 CLINICAL DATA:  Fall, injury, pain EXAM: DG HIP (WITH OR WITHOUT PELVIS) 2-3V LEFT COMPARISON:  None. FINDINGS: Bones are osteopenic. Degenerative changes of the lumbosacral spine, SI joints and right hip. Bony pelvis appears intact. Left hip views demonstrate no displaced fracture or malalignment. Normal bowel gas pattern. IMPRESSION: Degenerative changes and osteopenia. No acute finding by plain radiography. Electronically Signed   By: Judie Petit.  Shick M.D.   On: 05/26/2018 12:46    EKG: Independently reviewed.   Assessment/Plan Active Problems:   Rhabdomyolysis  Mild case at best  IVF LR 75 mg/hr  Ck BMP and CK in AM  Hold ARB until better knowledge of renal function   Renal insufficiency  Monitor BMP, we do not know her baseline  Hold ARB in meantime   R arm pain  Ck XR  Tylenol prn  Essential hypertension  Hold ARB  BP goal <160/100 while inpatient   Hyperlipidemia  Hold Mevacor, replace with hosp formulary pravastatin 20 mg/d   Mild cognitive impairment  Fall risk  Appears near baseline per daughter  Boyd Kerbs decision making with daughter    DVT prophylaxis: Heparin Code Status: Full  Family Communication: Patient and daughter Disposition Plan: Likely DC tomorrow after labs Consults called: None  Admission status:  Observation   Severity of Illness: The appropriate patient status for this patient is OBSERVATION. Observation status is judged to be reasonable and necessary in order to provide the required intensity of service to ensure the patient's safety. The patient's presenting symptoms, physical exam findings, and initial radiographic and laboratory data in the context of their medical condition is felt to place them at decreased risk for further clinical deterioration. Furthermore, it is anticipated that the patient will be medically stable for discharge from the hospital within 2 midnights of admission. The following factors support the patient status of observation.   " The patient's presenting symptoms include elevated CK and very mild renal insufficiency. " The physical exam findings include no significant findings. " The initial radiographic and laboratory data are notable for no imaging abn and elevated CK and Cr.    Sharlene Dory, DO [facility] [email or web address] 05/26/2018, 4:02 PM

## 2018-05-27 DIAGNOSIS — M25511 Pain in right shoulder: Secondary | ICD-10-CM

## 2018-05-27 DIAGNOSIS — R748 Abnormal levels of other serum enzymes: Secondary | ICD-10-CM | POA: Diagnosis not present

## 2018-05-27 DIAGNOSIS — N179 Acute kidney failure, unspecified: Secondary | ICD-10-CM

## 2018-05-27 DIAGNOSIS — W19XXXA Unspecified fall, initial encounter: Secondary | ICD-10-CM | POA: Diagnosis not present

## 2018-05-27 LAB — BASIC METABOLIC PANEL
ANION GAP: 6 (ref 5–15)
BUN: 22 mg/dL (ref 8–23)
CALCIUM: 8.5 mg/dL — AB (ref 8.9–10.3)
CHLORIDE: 107 mmol/L (ref 98–111)
CO2: 26 mmol/L (ref 22–32)
Creatinine, Ser: 0.95 mg/dL (ref 0.44–1.00)
GFR calc non Af Amer: 52 mL/min — ABNORMAL LOW (ref >60)
GLUCOSE: 141 mg/dL — AB (ref 70–99)
POTASSIUM: 4 mmol/L (ref 3.5–5.1)
Sodium: 139 mmol/L (ref 135–145)

## 2018-05-27 LAB — CBC
HCT: 33.1 % — ABNORMAL LOW (ref 36.0–46.0)
HEMOGLOBIN: 10.8 g/dL — AB (ref 12.0–15.0)
MCH: 27.6 pg (ref 26.0–34.0)
MCHC: 32.6 g/dL (ref 30.0–36.0)
MCV: 84.7 fL (ref 80.0–100.0)
NRBC: 0 % (ref 0.0–0.2)
PLATELETS: 247 10*3/uL (ref 150–400)
RBC: 3.91 MIL/uL (ref 3.87–5.11)
RDW: 14.2 % (ref 11.5–15.5)
WBC: 8.7 10*3/uL (ref 4.0–10.5)

## 2018-05-27 LAB — CK
Total CK: 1607 U/L — ABNORMAL HIGH (ref 38–234)
Total CK: 1504 U/L — ABNORMAL HIGH (ref 38–234)

## 2018-05-27 MED ORDER — LACTATED RINGERS IV SOLN
INTRAVENOUS | Status: AC
  Administered 2018-05-28: via INTRAVENOUS

## 2018-05-27 MED ORDER — SODIUM CHLORIDE 0.9 % IV SOLN
1.0000 g | INTRAVENOUS | Status: DC
  Administered 2018-05-27: 1 g via INTRAVENOUS
  Filled 2018-05-27: qty 10

## 2018-05-27 NOTE — Plan of Care (Signed)
  Problem: Health Behavior/Discharge Planning: Goal: Ability to manage health-related needs will improve Outcome: Progressing   Problem: Clinical Measurements: Goal: Ability to maintain clinical measurements within normal limits will improve Outcome: Progressing   Problem: Safety: Goal: Ability to remain free from injury will improve Outcome: Progressing   

## 2018-05-27 NOTE — Progress Notes (Signed)
   05/27/18 1100  Clinical Encounter Type  Visited With Health care provider  Visit Type Initial  Referral From Nurse  Consult/Referral To Chaplain  Recommendations Will refer to day-time chaplain on Monday   Spoke w/ care RN Sarah by phone in response to consult request for AD (new or update).  Per RN, pt doesn't have any procedures expected in the next 24 hours, so AD visit can wait until Monday.  If that changes, pls contact Spiritual Care and indicate need for AD as stat.  Margretta Sidle resident, [phone]

## 2018-05-27 NOTE — Progress Notes (Addendum)
PROGRESS NOTE                                                                                                                                                                                                             Patient Demographics:    Patricia Lynch, is a 84 y.o. female, DOB - Apr 28, 1929, LKG:[id number]  Admit date - 05/26/2018   Admitting Physician Jonah Blue, MD  Outpatient Primary MD for the patient is Jarome Matin, MD  LOS - 0  Chief Complaint  Patient presents with  . Fall       Brief Narrative  Reighn Kaplan is a 84 y.o. female with medical history significant of hyperlipidemia, hypertension, and dementia, lives at home alone with close support with her daughter who lives nearby and checks on her several times comes to the hospital after a mechanical fall which she sustained in the bathroom while turning, she subsequently fell into the bathtub and could not get up for several hours.  Till her daughter found her wrist.  She was subsequently brought to the hospital where she complained of some posterior head injury, arm pain, was found to have rhabdo along with scalp hematoma and admitted to the hospital.    Subjective:    Patricia Lynch today is in bed somewhat sleepy but appears to be in no distress, denies any headache chest or abdominal pain.   Assessment  & Plan :     1.  Mechanical fall causing mild rhabdomyolysis, posterior scalp hematoma, some muscle injury in arms and legs.  No fractures found so far, head CT unremarkable, she has no focal deficits, no headache, rhabdo is improving with IVF hydration, we await PT and OT eval.  She lives alone.  High risk for falling again.  Increase activity.  If able to ambulate and rhabdo better discharge tomorrow.  2.  Rhabdomyolysis.  Hydrate with IV fluids and monitor.  3.  Dyslipidemia.  Hold statin until CK levels stabilized.  4.  Dementia.  Remains at risk for delirium, daughter educated, minimize narcotics and  benzodiazepines.  Supportive care.  5.  Right shoulder and arm pain post fall.  Likely musculoskeletal injury.  Right shoulder x-ray unremarkable.  PT OT consult and increase activity.  Monitor.  6.  ARF.  Improving with IV fluids, continue to hydrate, hold ARB.  7.  UTI.  Placed on IV Rocephin, follow cultures, continue IV fluids.    Family Communication  :  daughter  Code Status :  Full  Disposition Plan  :  Med  Consults  :  None  Procedures  :   X-ray of bilateral hips and pelvis.  Nonacute.    CT head and C-spine.  Nonacute  DVT Prophylaxis  :   Heparin   Lab Results  Component Value Date   PLT 247 05/27/2018    Diet :  Diet Order            Diet regular Room service appropriate? Yes; Fluid consistency: Thin  Diet effective now               Inpatient Medications Scheduled Meds: . heparin  5,000 Units Subcutaneous Q8H  . pravastatin  20 mg Oral q1800   Continuous Infusions: . lactated ringers     PRN Meds:.acetaminophen, magnesium hydroxide, ondansetron  Antibiotics  :   Anti-infectives (From admission, onward)   None          Objective:   Vitals:   05/26/18 1515 05/26/18 1821 05/26/18 2326 05/27/18 0516  BP: (!) 126/57 (!) 124/55 (!) 95/59 (!) 121/54  Pulse: 72 78 90 64  Resp: 15  18 16   Temp:   98.6 F (37 C) 97.9 F (36.6 C)  TempSrc:   Oral Axillary  SpO2: 100% 98% 100% 95%    Wt Readings from Last 3 Encounters:  No data found for Wt     Intake/Output Summary (Last 24 hours) at 05/27/2018 1000 Last data filed at 05/27/2018 0541 Gross per 24 hour  Intake 1476.18 ml  Output -  Net 1476.18 ml     Physical Exam  Somnolent and sleepy but in no discomfort, moves all 4 extremities [place].AT,PERRAL Supple Neck,No JVD, No cervical lymphadenopathy appriciated.  Symmetrical Chest wall movement, Good air movement bilaterally, CTAB RRR,No Gallops,Rubs or new Murmurs, No Parasternal Heave +ve B.Sounds, Abd Soft, No tenderness, No  organomegaly appriciated, No rebound - guarding or rigidity. No Cyanosis, Clubbing or edema, No new Rash or bruise       Data Review:    CBC Recent Labs  Lab 05/26/18 1151 05/27/18 0346  WBC 10.3 8.7  HGB 11.5* 10.8*  HCT 36.9 33.1*  PLT 262 247  MCV 85.8 84.7  MCH 26.7 27.6  MCHC 31.2 32.6  RDW 13.8 14.2  LYMPHSABS 0.8  --   MONOABS 0.7  --   EOSABS 0.0  --   BASOSABS 0.0  --     Chemistries  Recent Labs  Lab 05/26/18 1151 05/27/18 0346  NA 137 139  K 3.6 4.0  CL 104 107  CO2 26 26  GLUCOSE 172* 141*  BUN 32* 22  CREATININE 1.13* 0.95  CALCIUM 8.8* 8.5*   ------------------------------------------------------------------------------------------------------------------ No results for input(s): CHOL, HDL, LDLCALC, TRIG, CHOLHDL, LDLDIRECT in the last 72 hours.  No results found for: HGBA1C ------------------------------------------------------------------------------------------------------------------ No results for input(s): TSH, T4TOTAL, T3FREE, THYROIDAB in the last 72 hours.  Invalid input(s): FREET3 ------------------------------------------------------------------------------------------------------------------ No results for input(s): VITAMINB12, FOLATE, FERRITIN, TIBC, IRON, RETICCTPCT in the last 72 hours.  Coagulation profile No results for input(s): INR, PROTIME in the last 168 hours.  No results for input(s): DDIMER in the last 72 hours.  Cardiac Enzymes No results for input(s): CKMB, TROPONINI, MYOGLOBIN in the last 168 hours.  Invalid input(s): CK ------------------------------------------------------------------------------------------------------------------ No results found for: BNP  Micro Results No results found for this or any previous visit (from the past 240 hour(s)).  Radiology Reports Dg Shoulder Right  Result Date: 05/26/2018 CLINICAL DATA:  Fall.  Pain and bruising of right shoulder EXAM: RIGHT SHOULDER - 2+ VIEW  COMPARISON:  None. FINDINGS: Moderate degenerative joint disease changes in the right AC joint. Small bone fragments noted along the inferior aspect of the scapula near the inferior glenoid. These appear well corticated and therefore do not feel these are related to acute injury/fracture. Glenohumeral joint is intact. IMPRESSION: Moderate degenerative changes in the right AC joint. Small bone fragments along the inferolateral scapula near the glenoid. These appear corticated and are not felt to represent acute injury/fracture. These may be related to old injury. Electronically Signed   By: Charlett Nose M.D.   On: 05/26/2018 17:11   Ct Head Wo Contrast  Result Date: 05/26/2018 CLINICAL DATA:  Patient status post fall yesterday with a blow to the head. Initial encounter. EXAM: CT HEAD WITHOUT CONTRAST CT CERVICAL SPINE WITHOUT CONTRAST TECHNIQUE: Multidetector CT imaging of the head and cervical spine was performed following the standard protocol without intravenous contrast. Multiplanar CT image reconstructions of the cervical spine were also generated. COMPARISON:  None. FINDINGS: CT HEAD FINDINGS Brain: No evidence of acute infarction, hemorrhage, hydrocephalus, extra-axial collection or mass lesion/mass effect. Vascular: No hyperdense vessel or unexpected calcification. Skull: Normal. Negative for fracture or focal lesion. Sinuses/Orbits: Status post lens extraction.  Otherwise negative. Other: Scalp hematoma posteriorly noted. CT CERVICAL SPINE FINDINGS Alignment: Maintained. Skull base and vertebrae: No acute fracture. No primary bone lesion or focal pathologic process. Soft tissues and spinal canal: No prevertebral fluid or swelling. No visible canal hematoma. Disc levels: The patient has multilevel loss of disc space height with endplate spurring. Central disc protrusion at C3-4 is noted. Upper chest: There is some apical scar.  Otherwise negative. Other: None. IMPRESSION: Scalp hematoma without  underlying fracture or intracranial abnormality. No acute abnormality cervical spine. Cervical spondylosis. Electronically Signed   By: Drusilla Kanner M.D.   On: 05/26/2018 13:02   Ct Cervical Spine Wo Contrast  Result Date: 05/26/2018 CLINICAL DATA:  Patient status post fall yesterday with a blow to the head. Initial encounter. EXAM: CT HEAD WITHOUT CONTRAST CT CERVICAL SPINE WITHOUT CONTRAST TECHNIQUE: Multidetector CT imaging of the head and cervical spine was performed following the standard protocol without intravenous contrast. Multiplanar CT image reconstructions of the cervical spine were also generated. COMPARISON:  None. FINDINGS: CT HEAD FINDINGS Brain: No evidence of acute infarction, hemorrhage, hydrocephalus, extra-axial collection or mass lesion/mass effect. Vascular: No hyperdense vessel or unexpected calcification. Skull: Normal. Negative for fracture or focal lesion. Sinuses/Orbits: Status post lens extraction.  Otherwise negative. Other: Scalp hematoma posteriorly noted. CT CERVICAL SPINE FINDINGS Alignment: Maintained. Skull base and vertebrae: No acute fracture. No primary bone lesion or focal pathologic process. Soft tissues and spinal canal: No prevertebral fluid or swelling. No visible canal hematoma. Disc levels: The patient has multilevel loss of disc space height with endplate spurring. Central disc protrusion at C3-4 is noted. Upper chest: There is some apical scar.  Otherwise negative. Other: None. IMPRESSION: Scalp hematoma without underlying fracture or intracranial abnormality. No acute abnormality cervical spine. Cervical spondylosis. Electronically Signed   By: Drusilla Kanner M.D.   On: 05/26/2018 13:02   Dg Hip Unilat W Or Wo Pelvis 2-3 Views Left  Result Date: 05/26/2018 CLINICAL DATA:  Fall, injury, pain EXAM: DG HIP (WITH OR WITHOUT PELVIS) 2-3V LEFT COMPARISON:  None. FINDINGS: Bones are osteopenic. Degenerative changes of the lumbosacral spine, SI joints and  right hip. Bony pelvis appears intact. Left hip views demonstrate no displaced  fracture or malalignment. Normal bowel gas pattern. IMPRESSION: Degenerative changes and osteopenia. No acute finding by plain radiography. Electronically Signed   By: Judie Petit.  Shick M.D.   On: 05/26/2018 12:46    Time Spent in minutes  30   Susa Raring M.D on 05/27/2018 at 10:00 AM  To page go to [email or web address] - password [facility]

## 2018-05-27 NOTE — Evaluation (Signed)
Physical Therapy Evaluation Patient Details Name: Hilma Steinhilber MRN: [id number] DOB: 08-Jun-1929 Today's Date: 05/27/2018   History of Present Illness  Patient is an 84 y/o female presenting to the ED on 05/26/18 s/p fall at home. Patient with a PMH significant for hyperlipidemia, hypertension, and dementia. Head CT unremarkable. No acute injury noted on hip, pelvis, shoulder X-ray.   Clinical Impression  Ms. Shaver is a very pleasant 84 y/o female admitted with the above listed diagnosis. Patient reports that she lives alone and was Mod I with mobility prior to admission. Patient today requiring general min guard level assist for mobility for safety with heavy cueing for safety with RW. Daughter reports that she will be available 24/7 for patient at discharge, however, if unable may need to consider SNF due to poor safety awareness and high fall risk. PT to follow acutely to maximize safe functional mobility prior to d/c.     Follow Up Recommendations Supervision/Assistance - 24 hour;SNF;Home health PT(depending on family availability for care)    Equipment Recommendations  None recommended by PT    Recommendations for Other Services       Precautions / Restrictions Precautions Precautions: Fall Restrictions Weight Bearing Restrictions: No      Mobility  Bed Mobility Overal bed mobility: Needs Assistance Bed Mobility: Supine to Sit     Supine to sit: Min guard     General bed mobility comments: increased time and effort  Transfers Overall transfer level: Needs assistance Equipment used: Rolling walker (2 wheeled) Transfers: Sit to/from Stand Sit to Stand: Min guard         General transfer comment: heavy verbal cueing for hand placement and safety  Ambulation/Gait Ambulation/Gait assistance: Min guard Gait Distance (Feet): 150 Feet Assistive device: Rolling walker (2 wheeled) Gait Pattern/deviations: Step-through pattern;Decreased stride length;Trunk flexed;Drifts  right/left Gait velocity: decreased   General Gait Details: reduced safety awareness with AD - requires consistent cueing for safety; excessive forward flexion; requires cueing to navigate obstacles in hallway  Stairs            Wheelchair Mobility    Modified Rankin (Stroke Patients Only)       Balance Overall balance assessment: Needs assistance Sitting-balance support: No upper extremity supported;Feet supported Sitting balance-Leahy Scale: Fair     Standing balance support: Bilateral upper extremity supported;During functional activity Standing balance-Leahy Scale: Poor Standing balance comment: reliant on external support                             Pertinent Vitals/Pain Pain Assessment: No/denies pain    Home Living Family/patient expects to be discharged to:: Private residence Living Arrangements: Alone Available Help at Discharge: Family;Available 24 hours/day;Available PRN/intermittently Type of Home: House Home Access: Stairs to enter Entrance Stairs-Rails: Can reach both Entrance Stairs-Number of Steps: 4-5 Home Layout: One level Home Equipment: Walker - 2 wheels;Cane - single point Additional Comments: patients daughter reports she refuses to allow grab bars or shower seat to be installed    Prior Function Level of Independence: Independent with assistive device(s)         Comments: daughter assist with prividing meals     Hand Dominance        Extremity/Trunk Assessment   Upper Extremity Assessment Upper Extremity Assessment: Defer to OT evaluation    Lower Extremity Assessment Lower Extremity Assessment: Generalized weakness    Cervical / Trunk Assessment Cervical / Trunk Assessment: Kyphotic  Communication  Communication: No difficulties  Cognition Arousal/Alertness: Awake/alert Behavior During Therapy: WFL for tasks assessed/performed Overall Cognitive Status: Within Functional Limits for tasks assessed                                         General Comments      Exercises     Assessment/Plan    PT Assessment Patient needs continued PT services  PT Problem List Decreased strength;Decreased activity tolerance;Decreased balance;Decreased mobility;Decreased knowledge of use of DME;Decreased safety awareness       PT Treatment Interventions DME instruction;Gait training;Stair training;Functional mobility training;Therapeutic activities;Therapeutic exercise;Balance training;Neuromuscular re-education;Patient/family education    PT Goals (Current goals can be found in the Care Plan section)  Acute Rehab PT Goals Patient Stated Goal: return home PT Goal Formulation: With patient Time For Goal Achievement: 06/10/18 Potential to Achieve Goals: Good    Frequency Min 3X/week   Barriers to discharge        Co-evaluation               AM-PAC PT "6 Clicks" Daily Activity  Outcome Measure Difficulty turning over in bed (including adjusting bedclothes, sheets and blankets)?: A Little Difficulty moving from lying on back to sitting on the side of the bed? : A Little Difficulty sitting down on and standing up from a chair with arms (e.g., wheelchair, bedside commode, etc,.)?: Unable Help needed moving to and from a bed to chair (including a wheelchair)?: A Little Help needed walking in hospital room?: A Little Help needed climbing 3-5 steps with a railing? : A Lot 6 Click Score: 15    End of Session Equipment Utilized During Treatment: Gait belt Activity Tolerance: Patient tolerated treatment well Patient left: in bed;with call bell/phone within reach;with bed alarm set;with family/visitor present Nurse Communication: Mobility status PT Visit Diagnosis: Unsteadiness on feet (R26.81);Other abnormalities of gait and mobility (R26.89);Muscle weakness (generalized) (M62.81)    Time: 4782-9562 PT Time Calculation (min) (ACUTE ONLY): 29 min   Charges:   PT Evaluation $PT  Eval Moderate Complexity: 1 Mod PT Treatments $Gait Training: 8-22 mins      Kipp Laurence, PT, DPT Supplemental Physical Therapist 05/27/18 1:37 PM Pager: [phone] Office: [phone]

## 2018-05-28 DIAGNOSIS — M25511 Pain in right shoulder: Secondary | ICD-10-CM | POA: Diagnosis not present

## 2018-05-28 DIAGNOSIS — R748 Abnormal levels of other serum enzymes: Secondary | ICD-10-CM | POA: Diagnosis not present

## 2018-05-28 DIAGNOSIS — N179 Acute kidney failure, unspecified: Secondary | ICD-10-CM | POA: Diagnosis not present

## 2018-05-28 LAB — BASIC METABOLIC PANEL
Anion gap: 5 (ref 5–15)
BUN: 17 mg/dL (ref 8–23)
CALCIUM: 8.3 mg/dL — AB (ref 8.9–10.3)
CO2: 26 mmol/L (ref 22–32)
CREATININE: 1.1 mg/dL — AB (ref 0.44–1.00)
Chloride: 109 mmol/L (ref 98–111)
GFR calc Af Amer: 51 mL/min — ABNORMAL LOW (ref >60)
GFR, EST NON AFRICAN AMERICAN: 44 mL/min — AB (ref >60)
Glucose, Bld: 104 mg/dL — ABNORMAL HIGH (ref 70–99)
Potassium: 3.7 mmol/L (ref 3.5–5.1)
SODIUM: 140 mmol/L (ref 135–145)

## 2018-05-28 LAB — MAGNESIUM: MAGNESIUM: 1.9 mg/dL (ref 1.7–2.4)

## 2018-05-28 LAB — CK: Total CK: 844 U/L — ABNORMAL HIGH (ref 38–234)

## 2018-05-28 MED ORDER — CEPHALEXIN 500 MG PO CAPS: 500.0000 mg | ORAL_CAPSULE | Freq: Three times a day (TID) | ORAL | 0 refills | Status: AC

## 2018-05-28 MED FILL — CEPHALEXIN 500 MG CAPSULE: 500 | 3 days supply | Qty: 9 | Fill #0

## 2018-05-28 NOTE — Evaluation (Signed)
Occupational Therapy Evaluation Patient Details Name: Patricia Lynch MRN: [id number] DOB: 06-30-1929 Today's Date: 05/28/2018    History of Present Illness Patient is an 84 y/o female presenting to the ED on 05/26/18 s/p fall at home. Patient with a PMH significant for hyperlipidemia, hypertension, and dementia. Head CT unremarkable. No acute injury noted on hip, pelvis, shoulder X-ray.    Clinical Impression   PTA Pt mod I at home - sponge bathes, has life alert button, daughter brings dinners. Pt is currently min guard with heavy cues for safety with using RW (does not at baseline) for toilet transfer, peri care and LB dressing. Pt able to maintain standing for sink level grooming at min guard level. Pt presents with a history of falling and while I believe that she is close to baseline she will require 24 hour supervision for safety and ADL completion - especially supervision while washing up. Pt is set to dc today, so OT will not follow acutely. Recommend HHOT follow up to practice ADL in home environment and do home safety assessment.     Follow Up Recommendations  Supervision/Assistance - 24 hour ; HHOT   Equipment Recommendations  None recommended by OT(Pt declines shower seat)    Recommendations for Other Services       Precautions / Restrictions Precautions Precautions: Fall Restrictions Weight Bearing Restrictions: No      Mobility Bed Mobility Overal bed mobility: Needs Assistance Bed Mobility: Rolling;Sidelying to Sit Rolling: Min guard Sidelying to sit: Mod assist       General bed mobility comments: "This is harder than it was yesterday" mod A for trunk elevation, Pt able to scoot to   Transfers Overall transfer level: Needs assistance Equipment used: Rolling walker (2 wheeled) Transfers: Sit to/from Stand Sit to Stand: Min guard         General transfer comment: heavy verbal cueing for hand placement and safety    Balance Overall balance assessment: Needs  assistance Sitting-balance support: No upper extremity supported;Feet supported Sitting balance-Leahy Scale: Fair     Standing balance support: Bilateral upper extremity supported;During functional activity Standing balance-Leahy Scale: Fair Standing balance comment: static standing for short times without UE support - continues to require support in dynamic situations                           ADL either performed or assessed with clinical judgement   ADL Overall ADL's : Needs assistance/impaired Eating/Feeding: Independent   Grooming: Wash/dry hands;Wash/dry face;Oral care;Min guard;Standing Grooming Details (indicate cue type and reason): sink level Upper Body Bathing: Supervision/ safety;Sitting   Lower Body Bathing: Supervison/ safety;Sitting/lateral leans Lower Body Bathing Details (indicate cue type and reason): "I dont take showers anymore, I do it like in the olden days - sponge bathe" Upper Body Dressing : Supervision/safety   Lower Body Dressing: Min guard;Sit to/from stand Lower Body Dressing Details (indicate cue type and reason): able to don underwear without assist - min guard for safety  Toilet Transfer: Min guard;Ambulation;RW Toilet Transfer Details (indicate cue type and reason): cues for safety with RW Toileting- Clothing Manipulation and Hygiene: Min guard;Sit to/from stand Toileting - Clothing Manipulation Details (indicate cue type and reason): for front and back   Tub/Shower Transfer Details (indicate cue type and reason): Pt does not get in the tub anymore after her fall Functional mobility during ADLs: Min guard;Rolling walker;Cueing for safety General ADL Comments: discussed importance of wearing life alert, safety during ADL  with focus on shower/bathing safety     Vision Patient Visual Report: No change from baseline       Perception     Praxis      Pertinent Vitals/Pain Pain Assessment: No/denies pain     Hand Dominance Right    Extremity/Trunk Assessment Upper Extremity Assessment Upper Extremity Assessment: Overall WFL for tasks assessed;Generalized weakness   Lower Extremity Assessment Lower Extremity Assessment: Defer to PT evaluation   Cervical / Trunk Assessment Cervical / Trunk Assessment: Kyphotic   Communication Communication Communication: No difficulties   Cognition Arousal/Alertness: Awake/alert Behavior During Therapy: WFL for tasks assessed/performed Overall Cognitive Status: Within Functional Limits for tasks assessed                                     General Comments  no family present during session today    Exercises     Shoulder Instructions      Home Living Family/patient expects to be discharged to:: Private residence Living Arrangements: Alone Available Help at Discharge: Family;Available 24 hours/day;Available PRN/intermittently Type of Home: House Home Access: Stairs to enter [employer] of Steps: 4-5 Entrance Stairs-Rails: Can reach both Home Layout: One level     Bathroom Shower/Tub: Chief Strategy Officer: Standard     Home Equipment: Environmental consultant - 2 wheels;Cane - single point   Additional Comments: patients daughter reports she refuses to allow grab bars or shower seat to be installed      Prior Functioning/Environment Level of Independence: Independent with assistive device(s)        Comments: daughter assist with prividing meals        OT Problem List: Impaired balance (sitting and/or standing);Decreased safety awareness;Decreased knowledge of use of DME or AE      OT Treatment/Interventions:      OT Goals(Current goals can be found in the care plan section) Acute Rehab OT Goals Patient Stated Goal: return home OT Goal Formulation: With patient Time For Goal Achievement: 06/07/18 Potential to Achieve Goals: Good  OT Frequency:     Barriers to D/C:            Co-evaluation              AM-PAC PT  "6 Clicks" Daily Activity     Outcome Measure Help from another person eating meals?: None Help from another person taking care of personal grooming?: A Little Help from another person toileting, which includes using toliet, bedpan, or urinal?: A Little Help from another person bathing (including washing, rinsing, drying)?: A Little Help from another person to put on and taking off regular upper body clothing?: A Little Help from another person to put on and taking off regular lower body clothing?: A Little 6 Click Score: 19   End of Session Equipment Utilized During Treatment: Gait belt;Rolling walker Nurse Communication: Mobility status  Activity Tolerance: Patient tolerated treatment well Patient left: in chair;with call bell/phone within reach;with chair alarm set  OT Visit Diagnosis: Unsteadiness on feet (R26.81);Other abnormalities of gait and mobility (R26.89);Repeated falls (R29.6);History of falling (Z91.81);Muscle weakness (generalized) (M62.81)                Time: 7846-9629 OT Time Calculation (min): 35 min Charges:  OT General Charges $OT Visit: 1 Visit OT Evaluation $OT Eval Moderate Complexity: 1 Mod  Sherryl Manges OTR/L Acute Rehabilitation Services Pager: [phone] Office: [phone]  Patricia Lynch Patricia Lynch  05/28/2018, 9:40 AM

## 2018-05-28 NOTE — Progress Notes (Signed)
CSW notes SNF recommendation if patient does not have 24 hour assistance at home. Patient is discharging home with daughter (does not meet Medicare SNF requirements under observation status).   CSW signing off.  Osborne Casco Yitzhak Awan LCSW [phone]

## 2018-05-28 NOTE — Discharge Summary (Signed)
Patricia Lynch WUJ:[id number] DOB: 12/28/1928 DOA: 05/26/2018  PCP: Jarome Matin, MD  Admit date: 05/26/2018  Discharge date: 05/28/2018  Admitted From: Home   Disposition:  Home   Recommendations for Outpatient Follow-up:   Follow up with PCP in 1-2 weeks  PCP Please obtain BMP/CBC, 2 view CXR in 1week,  (see Discharge instructions)   PCP Please follow up on the following pending results:    Home Health: PT,RN,S Work, Aide   Equipment/Devices: Walker rolling  Consultations: None Discharge Condition: Stable   CODE STATUS: Full   Diet Recommendation: Heart Healthy   Chief Complaint  Patient presents with  . Fall     Brief history of present illness from the day of admission and additional interim summary    Patricia Riceis a 84 y.o.femalewith medical history significant ofhyperlipidemia, hypertension, and dementia, lives at home alone with close support with her daughter who lives nearby and checks on her several times comes to the hospital after a mechanical fall which she sustained in the bathroom while turning, she subsequently fell into the bathtub and could not get up for several hours.  Till her daughter found her wrist.  She was subsequently brought to the hospital where she complained of some posterior head injury, arm pain, was found to have rhabdo along with scalp hematoma and admitted to the hospital.                                                                 Hospital Course    1.  Mechanical fall causing mild rhabdomyolysis, posterior scalp hematoma, some muscle injury in arms and legs.  No fractures found so far, head CT unremarkable, she had no focal deficits, no headache, rhabdomyolysis almost completely improved, worked with PT, close to her baseline and eager to go home, will be discharged  home today on her home medications with PT-iron-social work along with a rolling walker back to her home.  2.  Rhabdomyolysis.  Trend much improved after hydration today less than 600 CK, PCP to repeat in a week if needed.  3.  Dyslipidemia.  Resume statin from tomorrow at home at home dose.  4.  Dementia.    Continue with supportive care.  5.  Right shoulder and arm pain post fall.  Likely musculoskeletal injury.  Right shoulder x-ray unremarkable.    Discomfort much improved and almost completely resolved, worked well with PT, good range of motion no other issues.  6.  ARF.  Completely resolved after hydration.  7.  UTI.    Did well to IV Rocephin, cultures pending, will get 3 more days of oral Keflex upon discharge.   Discharge diagnosis     Active Problems:   Rhabdomyolysis   Essential hypertension   Hyperlipidemia   Mild cognitive impairment  Renal insufficiency   Right arm pain    Discharge instructions    Discharge Instructions    Diet - low sodium heart healthy   Complete by:  As directed    Discharge instructions   Complete by:  As directed    Follow with Primary MD Jarome Matin, MD in 7 days   Get CBC, CMP, CK checked  by Primary MD  in 5-7 days  Activity: As tolerated with Full fall precautions use walker/cane & assistance as needed  Disposition Home    Diet: Heart Healthy   Special Instructions: If you have smoked or chewed Tobacco  in the last 2 yrs please stop smoking, stop any regular Alcohol  and or any Recreational drug use.  On your next visit with your primary care physician please Get Medicines reviewed and adjusted.  Please request your Prim.MD to go over all Hospital Tests and Procedure/Radiological results at the follow up, please get all Hospital records sent to your Prim MD by signing hospital release before you go home.  If you experience worsening of your admission symptoms, develop shortness of breath, life threatening  emergency, suicidal or homicidal thoughts you must seek medical attention immediately by calling 911 or calling your MD immediately  if symptoms less severe.  You Must read complete instructions/literature along with all the possible adverse reactions/side effects for all the Medicines you take and that have been prescribed to you. Take any new Medicines after you have completely understood and accpet all the possible adverse reactions/side effects.   Increase activity slowly   Complete by:  As directed       Discharge Medications   Allergies as of 05/28/2018      Reactions   Beef-derived Products    Other    Some vegetables      Medication List    TAKE these medications   cephALEXin 500 MG capsule Commonly known as:  KEFLEX Take 1 capsule (500 mg total) by mouth 3 (three) times daily for 3 days.   losartan 50 MG tablet Commonly known as:  COZAAR Take 50 mg by mouth at bedtime.   lovastatin 10 MG tablet Commonly known as:  MEVACOR Take 10 mg by mouth daily.            Durable Medical Equipment  (From admission, onward)         Start     Ordered   05/28/18 0759  For home use only DME Walker rolling  [facility])  Once    Question:  Patient needs a walker to treat with the following condition  Answer:  Fall   05/28/18 0758          Follow-up Information    Jarome Matin, MD. Schedule an appointment as soon as possible for a visit in 1 week(s).   Specialty:  Internal Medicine Contact information: [address] [place] [address] [phone]           Major procedures and Radiology Reports - PLEASE review detailed and final reports thoroughly  -         Dg Shoulder Right  Result Date: 05/26/2018 CLINICAL DATA:  Fall.  Pain and bruising of right shoulder EXAM: RIGHT SHOULDER - 2+ VIEW COMPARISON:  None. FINDINGS: Moderate degenerative joint disease changes in the right AC joint. Small bone fragments noted along the inferior aspect of the  scapula near the inferior glenoid. These appear well corticated and therefore do not feel these are related to acute injury/fracture.  Glenohumeral joint is intact. IMPRESSION: Moderate degenerative changes in the right AC joint. Small bone fragments along the inferolateral scapula near the glenoid. These appear corticated and are not felt to represent acute injury/fracture. These may be related to old injury. Electronically Signed   By: Charlett Nose M.D.   On: 05/26/2018 17:11   Ct Head Wo Contrast  Result Date: 05/26/2018 CLINICAL DATA:  Patient status post fall yesterday with a blow to the head. Initial encounter. EXAM: CT HEAD WITHOUT CONTRAST CT CERVICAL SPINE WITHOUT CONTRAST TECHNIQUE: Multidetector CT imaging of the head and cervical spine was performed following the standard protocol without intravenous contrast. Multiplanar CT image reconstructions of the cervical spine were also generated. COMPARISON:  None. FINDINGS: CT HEAD FINDINGS Brain: No evidence of acute infarction, hemorrhage, hydrocephalus, extra-axial collection or mass lesion/mass effect. Vascular: No hyperdense vessel or unexpected calcification. Skull: Normal. Negative for fracture or focal lesion. Sinuses/Orbits: Status post lens extraction.  Otherwise negative. Other: Scalp hematoma posteriorly noted. CT CERVICAL SPINE FINDINGS Alignment: Maintained. Skull base and vertebrae: No acute fracture. No primary bone lesion or focal pathologic process. Soft tissues and spinal canal: No prevertebral fluid or swelling. No visible canal hematoma. Disc levels: The patient has multilevel loss of disc space height with endplate spurring. Central disc protrusion at C3-4 is noted. Upper chest: There is some apical scar.  Otherwise negative. Other: None. IMPRESSION: Scalp hematoma without underlying fracture or intracranial abnormality. No acute abnormality cervical spine. Cervical spondylosis. Electronically Signed   By: Drusilla Kanner M.D.   On:  05/26/2018 13:02   Ct Cervical Spine Wo Contrast  Result Date: 05/26/2018 CLINICAL DATA:  Patient status post fall yesterday with a blow to the head. Initial encounter. EXAM: CT HEAD WITHOUT CONTRAST CT CERVICAL SPINE WITHOUT CONTRAST TECHNIQUE: Multidetector CT imaging of the head and cervical spine was performed following the standard protocol without intravenous contrast. Multiplanar CT image reconstructions of the cervical spine were also generated. COMPARISON:  None. FINDINGS: CT HEAD FINDINGS Brain: No evidence of acute infarction, hemorrhage, hydrocephalus, extra-axial collection or mass lesion/mass effect. Vascular: No hyperdense vessel or unexpected calcification. Skull: Normal. Negative for fracture or focal lesion. Sinuses/Orbits: Status post lens extraction.  Otherwise negative. Other: Scalp hematoma posteriorly noted. CT CERVICAL SPINE FINDINGS Alignment: Maintained. Skull base and vertebrae: No acute fracture. No primary bone lesion or focal pathologic process. Soft tissues and spinal canal: No prevertebral fluid or swelling. No visible canal hematoma. Disc levels: The patient has multilevel loss of disc space height with endplate spurring. Central disc protrusion at C3-4 is noted. Upper chest: There is some apical scar.  Otherwise negative. Other: None. IMPRESSION: Scalp hematoma without underlying fracture or intracranial abnormality. No acute abnormality cervical spine. Cervical spondylosis. Electronically Signed   By: Drusilla Kanner M.D.   On: 05/26/2018 13:02   Dg Hip Unilat W Or Wo Pelvis 2-3 Views Left  Result Date: 05/26/2018 CLINICAL DATA:  Fall, injury, pain EXAM: DG HIP (WITH OR WITHOUT PELVIS) 2-3V LEFT COMPARISON:  None. FINDINGS: Bones are osteopenic. Degenerative changes of the lumbosacral spine, SI joints and right hip. Bony pelvis appears intact. Left hip views demonstrate no displaced fracture or malalignment. Normal bowel gas pattern. IMPRESSION: Degenerative changes and  osteopenia. No acute finding by plain radiography. Electronically Signed   By: Judie Petit.  Shick M.D.   On: 05/26/2018 12:46    Micro Results     No results found for this or any previous visit (from the past 240 hour(s)).  Today  Subjective    [name] today has no headache,no chest abdominal pain,no new weakness tingling or numbness, feels much better wants to go home today.     Objective   Blood pressure (!) 154/64, pulse 70, temperature 97.7 F (36.5 C), temperature source Oral, resp. rate 12, SpO2 97 %.   Intake/Output Summary (Last 24 hours) at 05/28/2018 0758 Last data filed at 05/28/2018 0542 Gross per 24 hour  Intake 533.58 ml  Output 1200 ml  Net -666.42 ml    Exam Awake Alert, Oriented x 3, No new F.N deficits, Normal affect Spencer.AT,PERRAL Supple Neck,No JVD, No cervical lymphadenopathy appriciated.  Symmetrical Chest wall movement, Good air movement bilaterally, CTAB RRR,No Gallops,Rubs or new Murmurs, No Parasternal Heave +ve B.Sounds, Abd Soft, Non tender, No organomegaly appriciated, No rebound -guarding or rigidity. No Cyanosis, Clubbing or edema, No new Rash or bruise   Data Review   CBC w Diff:  Lab Results  Component Value Date   WBC 8.7 05/27/2018   HGB 10.8 (L) 05/27/2018   HCT 33.1 (L) 05/27/2018   PLT 247 05/27/2018   LYMPHOPCT 8 05/26/2018   MONOPCT 7 05/26/2018   EOSPCT 0 05/26/2018   BASOPCT 0 05/26/2018    CMP:  Lab Results  Component Value Date   NA 140 05/28/2018   K 3.7 05/28/2018   CL 109 05/28/2018   CO2 26 05/28/2018   BUN 17 05/28/2018   CREATININE 1.10 (H) 05/28/2018  .   Total Time in preparing paper work, data evaluation and todays exam - 35 minutes  Susa Raring M.D on 05/28/2018 at 7:58 AM  [facility]   Office  [phone]

## 2018-05-28 NOTE — Progress Notes (Signed)
   05/28/18 1300  Clinical Encounter Type  Visited With Patient and family together  Visit Type Initial;Follow-up  Referral From Nurse  Spiritual Encounters  Spiritual Needs Other (Comment)  Stress Factors  Patient Stress Factors None identified  Family Stress Factors None identified   Followed up on Spiritual consult from the weekend. PT was alert and daughter was at bedside. I offered information in the AD and they were wanting to look over AD. I offered spiritual care with ministry of presence and words of encouragement. Chaplain available as needed.   Chaplain Orest Dikes [phone]

## 2018-05-28 NOTE — Discharge Instructions (Signed)
Follow with Primary MD Jarome Matin, MD in 7 days   Get CBC, CMP, CK checked  by Primary MD  in 5-7 days  Activity: As tolerated with Full fall precautions use walker/cane & assistance as needed  Disposition Home    Diet: Heart Healthy   Special Instructions: If you have smoked or chewed Tobacco  in the last 2 yrs please stop smoking, stop any regular Alcohol  and or any Recreational drug use.  On your next visit with your primary care physician please Get Medicines reviewed and adjusted.  Please request your Prim.MD to go over all Hospital Tests and Procedure/Radiological results at the follow up, please get all Hospital records sent to your Prim MD by signing hospital release before you go home.  If you experience worsening of your admission symptoms, develop shortness of breath, life threatening emergency, suicidal or homicidal thoughts you must seek medical attention immediately by calling 911 or calling your MD immediately  if symptoms less severe.  You Must read complete instructions/literature along with all the possible adverse reactions/side effects for all the Medicines you take and that have been prescribed to you. Take any new Medicines after you have completely understood and accpet all the possible adverse reactions/side effects.

## 2018-05-29 LAB — URINE CULTURE: Culture: >=100000 — AB

## 2018-06-06 DIAGNOSIS — D6489 Other specified anemias: Secondary | ICD-10-CM | POA: Diagnosis not present

## 2018-06-06 DIAGNOSIS — W19XXXS Unspecified fall, sequela: Secondary | ICD-10-CM | POA: Diagnosis not present

## 2018-06-06 DIAGNOSIS — M6282 Rhabdomyolysis: Secondary | ICD-10-CM | POA: Diagnosis not present

## 2018-06-06 DIAGNOSIS — R2689 Other abnormalities of gait and mobility: Secondary | ICD-10-CM | POA: Diagnosis not present

## 2018-06-06 DIAGNOSIS — Z6823 Body mass index (BMI) 23.0-23.9, adult: Secondary | ICD-10-CM | POA: Diagnosis not present

## 2018-06-06 DIAGNOSIS — N183 Chronic kidney disease, stage 3 (moderate): Secondary | ICD-10-CM | POA: Diagnosis not present

## 2018-06-06 DIAGNOSIS — I10 Essential (primary) hypertension: Secondary | ICD-10-CM | POA: Diagnosis not present

## 2018-06-06 DIAGNOSIS — M545 Low back pain: Secondary | ICD-10-CM | POA: Diagnosis not present

## 2018-06-08 DIAGNOSIS — R3129 Other microscopic hematuria: Secondary | ICD-10-CM | POA: Diagnosis not present

## 2018-06-08 DIAGNOSIS — N390 Urinary tract infection, site not specified: Secondary | ICD-10-CM | POA: Diagnosis not present

## 2018-06-22 DIAGNOSIS — N183 Chronic kidney disease, stage 3 (moderate): Secondary | ICD-10-CM | POA: Diagnosis not present

## 2018-06-22 DIAGNOSIS — E7849 Other hyperlipidemia: Secondary | ICD-10-CM | POA: Diagnosis not present

## 2018-06-22 DIAGNOSIS — R7309 Other abnormal glucose: Secondary | ICD-10-CM | POA: Diagnosis not present

## 2018-06-22 DIAGNOSIS — M810 Age-related osteoporosis without current pathological fracture: Secondary | ICD-10-CM | POA: Diagnosis not present

## 2018-06-25 DIAGNOSIS — R82998 Other abnormal findings in urine: Secondary | ICD-10-CM | POA: Diagnosis not present

## 2018-06-25 DIAGNOSIS — N183 Chronic kidney disease, stage 3 (moderate): Secondary | ICD-10-CM | POA: Diagnosis not present

## 2018-06-28 DIAGNOSIS — Z6824 Body mass index (BMI) 24.0-24.9, adult: Secondary | ICD-10-CM | POA: Diagnosis not present

## 2018-06-28 DIAGNOSIS — Z1389 Encounter for screening for other disorder: Secondary | ICD-10-CM | POA: Diagnosis not present

## 2018-06-28 DIAGNOSIS — I10 Essential (primary) hypertension: Secondary | ICD-10-CM | POA: Diagnosis not present

## 2018-06-28 DIAGNOSIS — E119 Type 2 diabetes mellitus without complications: Secondary | ICD-10-CM | POA: Diagnosis not present

## 2018-06-28 DIAGNOSIS — M810 Age-related osteoporosis without current pathological fracture: Secondary | ICD-10-CM | POA: Diagnosis not present

## 2018-06-28 DIAGNOSIS — R2689 Other abnormalities of gait and mobility: Secondary | ICD-10-CM | POA: Diagnosis not present

## 2018-06-28 DIAGNOSIS — Z0000 Encounter for general adult medical examination without abnormal findings: Secondary | ICD-10-CM | POA: Diagnosis not present

## 2018-06-28 DIAGNOSIS — D6489 Other specified anemias: Secondary | ICD-10-CM | POA: Diagnosis not present

## 2018-06-28 DIAGNOSIS — N183 Chronic kidney disease, stage 3 (moderate): Secondary | ICD-10-CM | POA: Diagnosis not present

## 2018-06-28 DIAGNOSIS — M545 Low back pain: Secondary | ICD-10-CM | POA: Diagnosis not present

## 2018-06-28 DIAGNOSIS — E7849 Other hyperlipidemia: Secondary | ICD-10-CM | POA: Diagnosis not present

## 2018-07-08 ENCOUNTER — Emergency Department (HOSPITAL_COMMUNITY): Payer: Medicare Other

## 2018-07-08 ENCOUNTER — Encounter (HOSPITAL_COMMUNITY): Payer: Self-pay | Admitting: Emergency Medicine

## 2018-07-08 ENCOUNTER — Inpatient Hospital Stay (HOSPITAL_COMMUNITY)
Admission: EM | Admit: 2018-07-08 | Discharge: 2018-07-12 | DRG: 070 | Disposition: A | Discharge status: Skilled Nursing Facility | Payer: Medicare Other | Attending: Internal Medicine | Admitting: Internal Medicine

## 2018-07-08 DIAGNOSIS — L89153 Pressure ulcer of sacral region, stage 3: Secondary | ICD-10-CM | POA: Diagnosis present

## 2018-07-08 DIAGNOSIS — J9811 Atelectasis: Secondary | ICD-10-CM | POA: Diagnosis present

## 2018-07-08 DIAGNOSIS — R627 Adult failure to thrive: Secondary | ICD-10-CM | POA: Diagnosis present

## 2018-07-08 DIAGNOSIS — R270 Ataxia, unspecified: Secondary | ICD-10-CM | POA: Diagnosis not present

## 2018-07-08 DIAGNOSIS — S0990XA Unspecified injury of head, initial encounter: Secondary | ICD-10-CM | POA: Diagnosis not present

## 2018-07-08 DIAGNOSIS — R778 Other specified abnormalities of plasma proteins: Secondary | ICD-10-CM

## 2018-07-08 DIAGNOSIS — S299XXA Unspecified injury of thorax, initial encounter: Secondary | ICD-10-CM | POA: Diagnosis not present

## 2018-07-08 DIAGNOSIS — S81812A Laceration without foreign body, left lower leg, initial encounter: Secondary | ICD-10-CM | POA: Diagnosis present

## 2018-07-08 DIAGNOSIS — L8990 Pressure ulcer of unspecified site, unspecified stage: Secondary | ICD-10-CM

## 2018-07-08 DIAGNOSIS — S199XXA Unspecified injury of neck, initial encounter: Secondary | ICD-10-CM | POA: Diagnosis not present

## 2018-07-08 DIAGNOSIS — E785 Hyperlipidemia, unspecified: Secondary | ICD-10-CM | POA: Diagnosis present

## 2018-07-08 DIAGNOSIS — G9340 Encephalopathy, unspecified: Secondary | ICD-10-CM | POA: Diagnosis present

## 2018-07-08 DIAGNOSIS — Z7189 Other specified counseling: Secondary | ICD-10-CM

## 2018-07-08 DIAGNOSIS — S81811A Laceration without foreign body, right lower leg, initial encounter: Secondary | ICD-10-CM | POA: Diagnosis not present

## 2018-07-08 DIAGNOSIS — I959 Hypotension, unspecified: Secondary | ICD-10-CM | POA: Diagnosis not present

## 2018-07-08 DIAGNOSIS — N183 Chronic kidney disease, stage 3 (moderate): Secondary | ICD-10-CM | POA: Diagnosis present

## 2018-07-08 DIAGNOSIS — Z7982 Long term (current) use of aspirin: Secondary | ICD-10-CM

## 2018-07-08 DIAGNOSIS — Z91018 Allergy to other foods: Secondary | ICD-10-CM

## 2018-07-08 DIAGNOSIS — Y92009 Unspecified place in unspecified non-institutional (private) residence as the place of occurrence of the external cause: Secondary | ICD-10-CM

## 2018-07-08 DIAGNOSIS — G9341 Metabolic encephalopathy: Principal | ICD-10-CM | POA: Diagnosis present

## 2018-07-08 DIAGNOSIS — Z66 Do not resuscitate: Secondary | ICD-10-CM | POA: Diagnosis present

## 2018-07-08 DIAGNOSIS — R296 Repeated falls: Secondary | ICD-10-CM | POA: Diagnosis present

## 2018-07-08 DIAGNOSIS — T189XXA Foreign body of alimentary tract, part unspecified, initial encounter: Secondary | ICD-10-CM

## 2018-07-08 DIAGNOSIS — S0003XA Contusion of scalp, initial encounter: Secondary | ICD-10-CM | POA: Diagnosis present

## 2018-07-08 DIAGNOSIS — T68XXXA Hypothermia, initial encounter: Secondary | ICD-10-CM | POA: Diagnosis not present

## 2018-07-08 DIAGNOSIS — W19XXXA Unspecified fall, initial encounter: Secondary | ICD-10-CM | POA: Diagnosis present

## 2018-07-08 DIAGNOSIS — N390 Urinary tract infection, site not specified: Secondary | ICD-10-CM | POA: Diagnosis present

## 2018-07-08 DIAGNOSIS — Z515 Encounter for palliative care: Secondary | ICD-10-CM

## 2018-07-08 DIAGNOSIS — M6282 Rhabdomyolysis: Secondary | ICD-10-CM | POA: Diagnosis not present

## 2018-07-08 DIAGNOSIS — E860 Dehydration: Secondary | ICD-10-CM | POA: Diagnosis present

## 2018-07-08 DIAGNOSIS — I10 Essential (primary) hypertension: Secondary | ICD-10-CM | POA: Diagnosis present

## 2018-07-08 DIAGNOSIS — Z23 Encounter for immunization: Secondary | ICD-10-CM

## 2018-07-08 DIAGNOSIS — R600 Localized edema: Secondary | ICD-10-CM | POA: Diagnosis present

## 2018-07-08 DIAGNOSIS — T07XXXA Unspecified multiple injuries, initial encounter: Secondary | ICD-10-CM | POA: Diagnosis not present

## 2018-07-08 DIAGNOSIS — F0390 Unspecified dementia without behavioral disturbance: Secondary | ICD-10-CM | POA: Diagnosis present

## 2018-07-08 DIAGNOSIS — D631 Anemia in chronic kidney disease: Secondary | ICD-10-CM | POA: Diagnosis present

## 2018-07-08 DIAGNOSIS — I129 Hypertensive chronic kidney disease with stage 1 through stage 4 chronic kidney disease, or unspecified chronic kidney disease: Secondary | ICD-10-CM | POA: Diagnosis present

## 2018-07-08 DIAGNOSIS — R7989 Other specified abnormal findings of blood chemistry: Secondary | ICD-10-CM | POA: Diagnosis not present

## 2018-07-08 HISTORY — DX: Unspecified dementia, unspecified severity, without behavioral disturbance, psychotic disturbance, mood disturbance, and anxiety: F03.90

## 2018-07-08 LAB — CBC WITH DIFFERENTIAL/PLATELET
Abs Immature Granulocytes: 0.07 10*3/uL (ref 0.00–0.07)
Basophils Absolute: 0 10*3/uL (ref 0.0–0.1)
Basophils Relative: 0 %
Eosinophils Absolute: 0 10*3/uL (ref 0.0–0.5)
Eosinophils Relative: 0 %
HCT: 34.2 % — ABNORMAL LOW (ref 36.0–46.0)
Hemoglobin: 10.9 g/dL — ABNORMAL LOW (ref 12.0–15.0)
Immature Granulocytes: 1 %
Lymphocytes Relative: 4 %
Lymphs Abs: 0.5 10*3/uL — ABNORMAL LOW (ref 0.7–4.0)
MCH: 27.2 pg (ref 26.0–34.0)
MCHC: 31.9 g/dL (ref 30.0–36.0)
MCV: 85.3 fL (ref 80.0–100.0)
MONOS PCT: 6 %
Monocytes Absolute: 0.7 10*3/uL (ref 0.1–1.0)
Neutro Abs: 11.5 10*3/uL — ABNORMAL HIGH (ref 1.7–7.7)
Neutrophils Relative %: 89 %
Platelets: 309 10*3/uL (ref 150–400)
RBC: 4.01 MIL/uL (ref 3.87–5.11)
RDW: 14 % (ref 11.5–15.5)
WBC: 12.8 10*3/uL — ABNORMAL HIGH (ref 4.0–10.5)
nRBC: 0 % (ref 0.0–0.2)

## 2018-07-08 LAB — URINALYSIS, ROUTINE W REFLEX MICROSCOPIC
Bilirubin Urine: NEGATIVE
Glucose, UA: 150 mg/dL — AB
Ketones, ur: 20 mg/dL — AB
Nitrite: NEGATIVE
Protein, ur: NEGATIVE mg/dL
SPECIFIC GRAVITY, URINE: 1.019 (ref 1.005–1.030)
pH: 5 (ref 5.0–8.0)

## 2018-07-08 LAB — COMPREHENSIVE METABOLIC PANEL
ALK PHOS: 153 U/L — AB (ref 38–126)
ALT: 51 U/L — ABNORMAL HIGH (ref 0–44)
AST: 112 U/L — ABNORMAL HIGH (ref 15–41)
Albumin: 2.5 g/dL — ABNORMAL LOW (ref 3.5–5.0)
Anion gap: 10 (ref 5–15)
BUN: 22 mg/dL (ref 8–23)
CO2: 24 mmol/L (ref 22–32)
Calcium: 8.2 mg/dL — ABNORMAL LOW (ref 8.9–10.3)
Chloride: 104 mmol/L (ref 98–111)
Creatinine, Ser: 1 mg/dL (ref 0.44–1.00)
GFR calc Af Amer: 59 mL/min — ABNORMAL LOW (ref >60)
GFR calc non Af Amer: 51 mL/min — ABNORMAL LOW (ref >60)
Glucose, Bld: 149 mg/dL — ABNORMAL HIGH (ref 70–99)
Potassium: 3.6 mmol/L (ref 3.5–5.1)
Sodium: 138 mmol/L (ref 135–145)
Total Bilirubin: 1 mg/dL (ref 0.3–1.2)
Total Protein: 5.3 g/dL — ABNORMAL LOW (ref 6.5–8.1)

## 2018-07-08 LAB — I-STAT TROPONIN, ED: Troponin i, poc: 0.98 ng/mL (ref 0.00–0.08)

## 2018-07-08 LAB — LACTIC ACID, PLASMA: Lactic Acid, Venous: 1.6 mmol/L (ref 0.5–1.9)

## 2018-07-08 LAB — CK: Total CK: 3163 U/L — ABNORMAL HIGH (ref 38–234)

## 2018-07-08 LAB — TROPONIN I: Troponin I: 0.79 ng/mL (ref <0.03)

## 2018-07-08 MED ORDER — ASPIRIN EC 81 MG PO TBEC
81.0000 mg | DELAYED_RELEASE_TABLET | Freq: Every day | ORAL | Status: DC
  Administered 2018-07-09 – 2018-07-12 (×4): 81 mg via ORAL
  Filled 2018-07-08 (×4): qty 1

## 2018-07-08 MED ORDER — SODIUM CHLORIDE 0.9 % IV SOLN
INTRAVENOUS | Status: DC
  Administered 2018-07-09 (×2): via INTRAVENOUS

## 2018-07-08 MED ORDER — ACETAMINOPHEN 325 MG PO TABS
650.0000 mg | ORAL_TABLET | Freq: Four times a day (QID) | ORAL | Status: DC | PRN
  Administered 2018-07-10: 650 mg via ORAL
  Filled 2018-07-08: qty 2

## 2018-07-08 MED ORDER — SODIUM CHLORIDE 0.9 % IV BOLUS
500.0000 mL | Freq: Once | INTRAVENOUS | Status: AC
  Administered 2018-07-08: 500 mL via INTRAVENOUS

## 2018-07-08 MED ORDER — LIDOCAINE HCL 2 % IJ SOLN
20.0000 mL | Freq: Once | INTRAMUSCULAR | Status: AC
  Administered 2018-07-08: 400 mg
  Filled 2018-07-08: qty 20

## 2018-07-08 MED ORDER — TETANUS-DIPHTH-ACELL PERTUSSIS 5-2.5-18.5 LF-MCG/0.5 IM SUSP
0.5000 mL | Freq: Once | INTRAMUSCULAR | Status: AC
  Administered 2018-07-08: 0.5 mL via INTRAMUSCULAR
  Filled 2018-07-08: qty 0.5

## 2018-07-08 MED ORDER — SODIUM CHLORIDE 0.9 % IV SOLN
1.0000 g | INTRAVENOUS | Status: DC
  Administered 2018-07-09 – 2018-07-12 (×4): 1 g via INTRAVENOUS
  Filled 2018-07-08 (×5): qty 10

## 2018-07-08 MED ORDER — ONDANSETRON HCL 4 MG PO TABS: 4.0000 mg | ORAL_TABLET | Freq: Four times a day (QID) | ORAL | Status: DC | PRN

## 2018-07-08 MED ORDER — ONDANSETRON HCL 4 MG/2ML IJ SOLN: 4.0000 mg | Freq: Four times a day (QID) | INTRAMUSCULAR | Status: DC | PRN

## 2018-07-08 MED ORDER — LOSARTAN POTASSIUM 50 MG PO TABS
50.0000 mg | ORAL_TABLET | Freq: Every day | ORAL | Status: DC
  Administered 2018-07-09 – 2018-07-11 (×3): 50 mg via ORAL
  Filled 2018-07-08 (×4): qty 1

## 2018-07-08 MED ORDER — ACETAMINOPHEN 650 MG RE SUPP: 650.0000 mg | Freq: Four times a day (QID) | RECTAL | Status: DC | PRN

## 2018-07-08 NOTE — ED Triage Notes (Signed)
Pt here from home with c/o a fall in her door way where she has laid for 24 hrs pt has a  Lac to the right leg

## 2018-07-08 NOTE — ED Notes (Signed)
Patient transported to CT 

## 2018-07-08 NOTE — ED Notes (Signed)
Rectal temp 93.0 bare hugger placed on pt

## 2018-07-08 NOTE — H&P (Signed)
History and Physical    Patricia Lynch ZOX:[id number] DOB: 06-11-1929 DOA: 07/08/2018  PCP: Jarome Matin, MD  Patient coming from: Home.  History obtained from patient's daughter.  Chief Complaint: Fall and confusion.  HPI: Patricia Lynch is a 84 y.o. female with history of hypertension hyperlipidemia was brought to the ER after patient had a fall.  As per the patient's daughter patient had a fall last evening around 5 PM and has been lying on the floor since then.  Patient tripped onto her main door and was caught in between.  She lacerated her right leg.  Was bleeding from the wound.  When patient's daughter went to check on her this morning she was on the floor lying but conscious weak and confused.  ED Course: In the ER patient was initially hypothermic which improved with bear hugger.  UA showing features concerning for UTI.  CK levels were around 3000 with mildly elevated troponin EKG showing normal sinus rhythm.  CT head and C-spine were unremarkable chest x-ray was unremarkable.  Per daughter patient has been very confused since the incident.  Review of Systems: As per HPI, rest all negative.   Past Medical History:  Diagnosis Date  . Hyperlipidemia   . Hypertension     History reviewed. No pertinent surgical history.   reports that she has never smoked. She has never used smokeless tobacco. Her alcohol and drug histories are not on file.  Allergies  Allergen Reactions  . Beef-Derived Products Nausea Only  . Other Nausea Only    Some vegetables    Family History  Family history unknown: Yes    Prior to Admission medications   Medication Sig Start Date End Date Taking? Authorizing Provider  acetaminophen (TYLENOL) 500 MG tablet Take by mouth every 6 (six) hours as needed for moderate pain.   Yes [provider]  losartan (COZAAR) 50 MG tablet Take 50 mg by mouth at bedtime. 04/14/18  Yes [provider]  lovastatin (MEVACOR) 10 MG tablet Take 10 mg by  mouth daily. 04/14/18  Yes [provider]  Menthol, Topical Analgesic, (BIOFREEZE EX) Apply 1 application topically daily as needed (back pain).   Yes [provider]    Physical Exam: Vitals:   07/08/18 1830 07/08/18 1930 07/08/18 2000 07/08/18 2032  BP: 124/64 126/60  125/64  Pulse: 84 91  92  Resp: 18 (!) 22  20  Temp:   98.6 F (37 C) 99.3 F (37.4 C)  TempSrc:   Oral Oral  SpO2: 98% 98%  96%  Weight:    52.4 kg  Height:    5\' 3"  (1.6 m)      Constitutional: Moderately built and nourished. Vitals:   07/08/18 1830 07/08/18 1930 07/08/18 2000 07/08/18 2032  BP: 124/64 126/60  125/64  Pulse: 84 91  92  Resp: 18 (!) 22  20  Temp:   98.6 F (37 C) 99.3 F (37.4 C)  TempSrc:   Oral Oral  SpO2: 98% 98%  96%  Weight:    52.4 kg  Height:    5\' 3"  (1.6 m)   Eyes: Anicteric no pallor. ENMT: No discharge from the ears eyes nose or mouth. Neck: No mass felt.  No neck rigidity. Respiratory: No rhonchi or crepitations. Cardiovascular: S1-S2 heard. Abdomen: Soft nontender bowel sounds present. Musculoskeletal: Bilateral lower extremity edema present.  Right leg laceration has been sutured. Skin: Right leg laceration has been sutured. Neurologic: Alert awake oriented to her name.  Moves all extremities. Psychiatric: Mildly confused.   Labs on Admission: I have personally reviewed following labs and imaging studies  CBC: Recent Labs  Lab 07/08/18 1627  WBC 12.8*  NEUTROABS 11.5*  HGB 10.9*  HCT 34.2*  MCV 85.3  PLT 309   Basic Metabolic Panel: Recent Labs  Lab 07/08/18 1627  NA 138  K 3.6  CL 104  CO2 24  GLUCOSE 149*  BUN 22  CREATININE 1.00  CALCIUM 8.2*   GFR: Estimated Creatinine Clearance: 32.8 mL/min (by C-G formula based on SCr of 1 mg/dL). Liver Function Tests: Recent Labs  Lab 07/08/18 1627  AST 112*  ALT 51*  ALKPHOS 153*  BILITOT 1.0  PROT 5.3*  ALBUMIN 2.5*   No results for input(s): LIPASE, AMYLASE in the last 168  hours. No results for input(s): AMMONIA in the last 168 hours. Coagulation Profile: No results for input(s): INR, PROTIME in the last 168 hours. Cardiac Enzymes: Recent Labs  Lab 07/08/18 1627  CKTOTAL 3,163*  TROPONINI 0.79*   BNP (last 3 results) No results for input(s): PROBNP in the last 8760 hours. HbA1C: No results for input(s): HGBA1C in the last 72 hours. CBG: No results for input(s): GLUCAP in the last 168 hours. Lipid Profile: No results for input(s): CHOL, HDL, LDLCALC, TRIG, CHOLHDL, LDLDIRECT in the last 72 hours. Thyroid Function Tests: No results for input(s): TSH, T4TOTAL, FREET4, T3FREE, THYROIDAB in the last 72 hours. Anemia Panel: No results for input(s): VITAMINB12, FOLATE, FERRITIN, TIBC, IRON, RETICCTPCT in the last 72 hours. Urine analysis:    Component Value Date/Time   COLORURINE YELLOW 07/08/2018 1922   APPEARANCEUR HAZY (A) 07/08/2018 1922   LABSPEC 1.019 07/08/2018 1922   PHURINE 5.0 07/08/2018 1922   GLUCOSEU 150 (A) 07/08/2018 1922   HGBUR MODERATE (A) 07/08/2018 1922   BILIRUBINUR NEGATIVE 07/08/2018 1922   KETONESUR 20 (A) 07/08/2018 1922   PROTEINUR NEGATIVE 07/08/2018 1922   NITRITE NEGATIVE 07/08/2018 1922   LEUKOCYTESUR LARGE (A) 07/08/2018 1922   Sepsis Labs: @LABRCNTIP (procalcitonin:4,lacticidven:4) )No results found for this or any previous visit (from the past 240 hour(s)).   Radiological Exams on Admission: Dg Tibia/fibula Left  Result Date: 07/08/2018 CLINICAL DATA:  The patient suffered a large laceration of the right lower leg due to a fall today. Initial encounter. EXAM: LEFT TIBIA AND FIBULA - 2 VIEW COMPARISON:  None. FINDINGS: Large skin defect is seen along the medial aspect of the lower leg consistent with laceration. No underlying fracture or foreign body. IMPRESSION: Laceration without underlying fracture or foreign body. Electronically Signed   By: Drusilla Kanner M.D.   On: 07/08/2018 16:24   Ct Head Wo  Contrast  Result Date: 07/08/2018 CLINICAL DATA:  Fall.  Ataxia. EXAM: CT HEAD WITHOUT CONTRAST CT CERVICAL SPINE WITHOUT CONTRAST TECHNIQUE: Multidetector CT imaging of the head and cervical spine was performed following the standard protocol without intravenous contrast. Multiplanar CT image reconstructions of the cervical spine were also generated. COMPARISON:  May 26, 2018 FINDINGS: CT HEAD FINDINGS Brain: No subdural, epidural, or subarachnoid hemorrhage. Ventricles and sulci are prominent but stable. Cerebellum, brainstem, and basal cisterns are normal. White matter changes identified. No acute cortical ischemia or infarct noted. No mass effect or midline shift. Vascular: No hyperdense vessel or unexpected calcification. Skull: Normal. Negative for fracture or focal lesion. Sinuses/Orbits: No acute finding. Other: None. CT CERVICAL SPINE FINDINGS Alignment: Normal. Skull base and vertebrae: No acute fracture. No primary bone lesion or focal pathologic process. Soft  tissues and spinal canal: No prevertebral fluid or swelling. No visible canal hematoma. Disc levels:  Multilevel degenerative disc disease. Upper chest: Negative. Other: No other abnormalities. IMPRESSION: 1. No acute intracranial abnormalities. 2. No fracture or traumatic malalignment in the cervical spine. Degenerative changes. Electronically Signed   By: Gerome Samavid  Williams III M.D   On: 07/08/2018 18:07   Ct Cervical Spine Wo Contrast  Result Date: 07/08/2018 CLINICAL DATA:  Fall.  Ataxia. EXAM: CT HEAD WITHOUT CONTRAST CT CERVICAL SPINE WITHOUT CONTRAST TECHNIQUE: Multidetector CT imaging of the head and cervical spine was performed following the standard protocol without intravenous contrast. Multiplanar CT image reconstructions of the cervical spine were also generated. COMPARISON:  May 26, 2018 FINDINGS: CT HEAD FINDINGS Brain: No subdural, epidural, or subarachnoid hemorrhage. Ventricles and sulci are prominent but stable.  Cerebellum, brainstem, and basal cisterns are normal. White matter changes identified. No acute cortical ischemia or infarct noted. No mass effect or midline shift. Vascular: No hyperdense vessel or unexpected calcification. Skull: Normal. Negative for fracture or focal lesion. Sinuses/Orbits: No acute finding. Other: None. CT CERVICAL SPINE FINDINGS Alignment: Normal. Skull base and vertebrae: No acute fracture. No primary bone lesion or focal pathologic process. Soft tissues and spinal canal: No prevertebral fluid or swelling. No visible canal hematoma. Disc levels:  Multilevel degenerative disc disease. Upper chest: Negative. Other: No other abnormalities. IMPRESSION: 1. No acute intracranial abnormalities. 2. No fracture or traumatic malalignment in the cervical spine. Degenerative changes. Electronically Signed   By: Gerome Samavid  Williams III M.D   On: 07/08/2018 18:07   Dg Chest Port 1 View  Result Date: 07/08/2018 CLINICAL DATA:  Patient status post fall today. EXAM: PORTABLE CHEST 1 VIEW COMPARISON:  None. FINDINGS: There is a small left pleural effusion and mild left basilar atelectasis. The right lung is clear. No right effusion. No pneumothorax on the right or left. Heart size is upper normal. Aortic atherosclerosis noted. No acute or focal bony abnormality. IMPRESSION: Small left pleural effusion and mild basilar airspace disease which is likely atelectasis rather than pneumonia. Atherosclerosis. Electronically Signed   By: Drusilla Kannerhomas  Dalessio M.D.   On: 07/08/2018 16:23    EKG: Independently reviewed.  Normal sinus rhythm.  Assessment/Plan Principal Problem:   Acute encephalopathy Active Problems:   Non-traumatic rhabdomyolysis   Essential hypertension   Troponin level elevated   Hypothermia   Acute lower UTI    1. Acute encephalopathy -could be multifactorial including hypothymia being lying down in the open with UTI and probable dehydration.  Check MRI brain. 2. Elevated troponin -denies  any chest pain.  Will trend cardiac markers if there is persistent elevation will consult cardiology.  On aspirin. 3. Rhabdomyolysis secondary to fall -continue with IV fluids check CK levels. 4. Hyperlipidemia we will hold statins due to rhabdomyolysis. 5. Hypertension on ARB. 6. UTI on ceftriaxone follow urine cultures. 7. Hypothermia secondary to lying outside which has improved now.  Check TSH follow cultures. 8. Laceration of the right leg which has been sutured.  Tdap ordered. 9. Bilateral lower extremity edema which has been noticed recently by patient's daughter for which Dopplers have been ordered.   DVT prophylaxis: Lovenox if there is no further bleed from the suturing area. Code Status: Full code. Family Communication: Patient's daughter. Disposition Plan: To be determined. Consults called: Cardiology and physical therapy. Admission status: Observation.   Eduard ClosArshad N Danely Bayliss MD [facility] Pager [phone].  If 7PM-7AM, please contact night-coverage [email or web address] Password [email or web address]  07/08/2018, 11:42 PM

## 2018-07-08 NOTE — ED Provider Notes (Signed)
[facility] EMERGENCY DEPARTMENT Provider Note   CSN: [id number] Arrival date & time: 07/08/18  1417     History   Chief Complaint Chief Complaint  Patient presents with  . Fall  . Extremity Laceration    HPI Patricia Lynch is a 84 y.o. female.  The history is provided by the patient. No language interpreter was used.  Fall  This is a new problem. The current episode started yesterday. The problem occurs constantly. The problem has been gradually worsening. Pertinent negatives include no chest pain, no abdominal pain and no headaches. Nothing aggravates the symptoms. Nothing relieves the symptoms. She has tried nothing for the symptoms.  Pt complains of a cut to her leg.  Pt reports she hit her leg on the door when she fell.  Pt lives alone. Pt's daughter thinks pt fell before dinner yesterday. She thinks pt may have gone to get the mail.  Daughter reports pt is more confused.  Pt has had increased confusion for the past month. Pt lives a lone.  Pt was in the doorway with door open for approx 20 hours.   Past Medical History:  Diagnosis Date  . Hyperlipidemia   . Hypertension     Patient Active Problem List   Diagnosis Date Noted  . Rhabdomyolysis 05/26/2018  . Essential hypertension 05/26/2018  . Hyperlipidemia 05/26/2018  . Mild cognitive impairment 05/26/2018  . Renal insufficiency 05/26/2018  . Right arm pain 05/26/2018    History reviewed. No pertinent surgical history.   OB History   None      Home Medications    Prior to Admission medications   Medication Sig Start Date End Date Taking? Authorizing Provider  acetaminophen (TYLENOL) 500 MG tablet Take by mouth every 6 (six) hours as needed for moderate pain.   Yes [provider]  losartan (COZAAR) 50 MG tablet Take 50 mg by mouth at bedtime. 04/14/18  Yes [provider]  lovastatin (MEVACOR) 10 MG tablet Take 10 mg by mouth daily. 04/14/18  Yes [provider]    Menthol, Topical Analgesic, (BIOFREEZE EX) Apply 1 application topically daily as needed (back pain).   Yes [provider]    Family History History reviewed. No pertinent family history.  Social History Social History   Tobacco Use  . Smoking status: Never Smoker  . Smokeless tobacco: Never Used  Substance Use Topics  . Alcohol use: Not on file  . Drug use: Not on file     Allergies   Beef-derived products and Other   Review of Systems Review of Systems  Cardiovascular: Negative for chest pain.  Gastrointestinal: Negative for abdominal pain.  Skin: Positive for wound.  Neurological: Negative for headaches.  Psychiatric/Behavioral: Positive for confusion.  All other systems reviewed and are negative.    Physical Exam Updated Vital Signs BP (!) 118/49   Pulse 71   Temp (!) 97.5 F (36.4 C) (Axillary)   Resp 16   SpO2 99%   Physical Exam  Constitutional: She appears well-developed and well-nourished.  HENT:  Head: Normocephalic.  Eyes: EOM are normal.  Neck: Normal range of motion.  Pulmonary/Chest: Effort normal.  Abdominal: She exhibits no distension.  Musculoskeletal: Normal range of motion.  Neurological: She is alert.  Confused, knows name and month  Skin:  12 cm gapping laceration right lower leg,   Psychiatric: She has a normal mood and affect.  Nursing note and vitals reviewed.    ED Treatments / Results  Labs (all labs ordered are listed, but only abnormal results are displayed) Labs Reviewed  CBC WITH DIFFERENTIAL/PLATELET - Abnormal; Notable for the following components:      Result Value   WBC 12.8 (*)    Hemoglobin 10.9 (*)    HCT 34.2 (*)    Neutro Abs 11.5 (*)    Lymphs Abs 0.5 (*)    All other components within normal limits  COMPREHENSIVE METABOLIC PANEL  URINALYSIS, ROUTINE W REFLEX MICROSCOPIC  LACTIC ACID, PLASMA  TROPONIN I  CK    EKG None  Radiology Dg Tibia/fibula Left  Result Date:  07/08/2018 CLINICAL DATA:  The patient suffered a large laceration of the right lower leg due to a fall today. Initial encounter. EXAM: LEFT TIBIA AND FIBULA - 2 VIEW COMPARISON:  None. FINDINGS: Large skin defect is seen along the medial aspect of the lower leg consistent with laceration. No underlying fracture or foreign body. IMPRESSION: Laceration without underlying fracture or foreign body. Electronically Signed   By: Drusilla Kannerhomas  Dalessio M.D.   On: 07/08/2018 16:24   Dg Chest Port 1 View  Result Date: 07/08/2018 CLINICAL DATA:  Patient status post fall today. EXAM: PORTABLE CHEST 1 VIEW COMPARISON:  None. FINDINGS: There is a small left pleural effusion and mild left basilar atelectasis. The right lung is clear. No right effusion. No pneumothorax on the right or left. Heart size is upper normal. Aortic atherosclerosis noted. No acute or focal bony abnormality. IMPRESSION: Small left pleural effusion and mild basilar airspace disease which is likely atelectasis rather than pneumonia. Atherosclerosis. Electronically Signed   By: Drusilla Kannerhomas  Dalessio M.D.   On: 07/08/2018 16:23    Procedures .Marland Kitchen.Laceration Repair Date/Time: 07/08/2018 7:32 PM Performed by: Elson AreasSofia, Patricia Norgaard Lynch, Patricia Lynch Authorized by: Elson AreasSofia, Caliann Leckrone Lynch, Patricia Lynch   Consent:    Consent obtained:  Verbal   Consent given by:  Patient   Risks discussed:  Infection, need for additional repair, pain, poor cosmetic result and poor wound healing   Alternatives discussed:  No treatment and delayed treatment Universal protocol:    Procedure explained and questions answered to patient or proxy's satisfaction: yes     Relevant documents present and verified: yes     Test results available and properly labeled: yes     Imaging studies available: yes     Required blood products, implants, devices, and special equipment available: yes     Site/side marked: yes     Immediately prior to procedure, a time out was called: yes     Patient identity confirmed:   Verbally with patient Anesthesia (see MAR for exact dosages):    Anesthesia method:  Local infiltration   Local anesthetic:  Lidocaine 2% w/o epi Laceration details:    Location:  Leg   Leg location:  L lower leg   Length (cm):  12   Depth (mm):  5 Repair type:    Repair type:  Complex Pre-procedure details:    Preparation:  Patient was prepped and draped in usual sterile fashion Exploration:    Wound exploration: wound explored through full range of motion     Contaminated: no   Treatment:    Area cleansed with:  Betadine   Irrigation solution:  Sterile saline   Visualized foreign bodies/material removed: no     Debridement:  None   Undermining:  None Skin repair:    Repair method:  Sutures and Steri-Strips   Suture size:  4-0   Suture technique:  Simple interrupted  Number of sutures:  14 Approximation:    Approximation:  Loose Post-procedure details:    Dressing:  Sterile dressing   Patient tolerance of procedure:  Tolerated well, no immediate complications Comments:     Pt and daughter advised wound may open due to edema in pt's lower legs,  Pt's skin is thin and wound is at high risk to open.   Steristrips used to help reinforce.  Pt may need wound care management    (including critical care time)  Medications Ordered in ED Medications  sodium chloride 0.9 % bolus 500 mL (500 mLs Intravenous New Bag/Given 07/08/18 1657)  lidocaine (XYLOCAINE) 2 % (with pres) injection 400 mg (has no administration in time range)     Initial Impression / Assessment and Plan / ED Course  I have reviewed the triage vital signs and the nursing notes.  Pertinent labs & imaging results that were available during my care of the patient were reviewed by me and considered in my medical decision making (see chart for details).    MDM  Pt's initial temp 93.  Temp improved to 97 after being under bear hugger. Labs returned pt has an elevated troponin.  Ck is elevated. Pt given Iv fluids x  1 liter.   Xrays no fracture, Ct head and c spine normal.    Final Clinical Impressions(s) / ED Diagnoses   Final diagnoses:  Hypothermia, initial encounter  Non-traumatic rhabdomyolysis  Laceration of left lower extremity, initial encounter  Troponin level elevated    ED Discharge Orders    None     I spoke to Dr. Gentry Fitz who will see and admit   Patricia Lynch 07/08/18 1943    Jacalyn Lefevre, MD 07/08/18 1955

## 2018-07-08 NOTE — Progress Notes (Signed)
Pt arrived to 3W01 from ED. Oriented x 4 with confusion. Welcomed and oriented to unit. Will continue to monitor.

## 2018-07-09 ENCOUNTER — Observation Stay (HOSPITAL_COMMUNITY): Payer: Medicare Other

## 2018-07-09 ENCOUNTER — Other Ambulatory Visit: Payer: Self-pay

## 2018-07-09 ENCOUNTER — Ambulatory Visit (HOSPITAL_COMMUNITY): Payer: Medicare Other

## 2018-07-09 ENCOUNTER — Encounter (HOSPITAL_COMMUNITY): Payer: Self-pay | Admitting: Physician Assistant

## 2018-07-09 ENCOUNTER — Observation Stay (HOSPITAL_BASED_OUTPATIENT_CLINIC_OR_DEPARTMENT_OTHER): Payer: Medicare Other

## 2018-07-09 DIAGNOSIS — Z515 Encounter for palliative care: Secondary | ICD-10-CM | POA: Diagnosis not present

## 2018-07-09 DIAGNOSIS — S81811A Laceration without foreign body, right lower leg, initial encounter: Secondary | ICD-10-CM | POA: Diagnosis present

## 2018-07-09 DIAGNOSIS — I340 Nonrheumatic mitral (valve) insufficiency: Secondary | ICD-10-CM

## 2018-07-09 DIAGNOSIS — R7989 Other specified abnormal findings of blood chemistry: Secondary | ICD-10-CM

## 2018-07-09 DIAGNOSIS — Z9181 History of falling: Secondary | ICD-10-CM | POA: Diagnosis not present

## 2018-07-09 DIAGNOSIS — F0390 Unspecified dementia without behavioral disturbance: Secondary | ICD-10-CM | POA: Diagnosis present

## 2018-07-09 DIAGNOSIS — R2689 Other abnormalities of gait and mobility: Secondary | ICD-10-CM | POA: Diagnosis not present

## 2018-07-09 DIAGNOSIS — Z91018 Allergy to other foods: Secondary | ICD-10-CM | POA: Diagnosis not present

## 2018-07-09 DIAGNOSIS — L89153 Pressure ulcer of sacral region, stage 3: Secondary | ICD-10-CM | POA: Diagnosis present

## 2018-07-09 DIAGNOSIS — G9341 Metabolic encephalopathy: Secondary | ICD-10-CM | POA: Diagnosis present

## 2018-07-09 DIAGNOSIS — E785 Hyperlipidemia, unspecified: Secondary | ICD-10-CM | POA: Diagnosis present

## 2018-07-09 DIAGNOSIS — M6282 Rhabdomyolysis: Secondary | ICD-10-CM | POA: Diagnosis present

## 2018-07-09 DIAGNOSIS — R2681 Unsteadiness on feet: Secondary | ICD-10-CM | POA: Diagnosis not present

## 2018-07-09 DIAGNOSIS — N390 Urinary tract infection, site not specified: Secondary | ICD-10-CM

## 2018-07-09 DIAGNOSIS — W19XXXA Unspecified fall, initial encounter: Secondary | ICD-10-CM | POA: Diagnosis not present

## 2018-07-09 DIAGNOSIS — R278 Other lack of coordination: Secondary | ICD-10-CM | POA: Diagnosis not present

## 2018-07-09 DIAGNOSIS — Z66 Do not resuscitate: Secondary | ICD-10-CM | POA: Diagnosis present

## 2018-07-09 DIAGNOSIS — R296 Repeated falls: Secondary | ICD-10-CM | POA: Diagnosis present

## 2018-07-09 DIAGNOSIS — R933 Abnormal findings on diagnostic imaging of other parts of digestive tract: Secondary | ICD-10-CM | POA: Diagnosis not present

## 2018-07-09 DIAGNOSIS — I10 Essential (primary) hypertension: Secondary | ICD-10-CM | POA: Diagnosis not present

## 2018-07-09 DIAGNOSIS — F0150 Vascular dementia without behavioral disturbance: Secondary | ICD-10-CM | POA: Diagnosis not present

## 2018-07-09 DIAGNOSIS — S0990XA Unspecified injury of head, initial encounter: Secondary | ICD-10-CM | POA: Diagnosis not present

## 2018-07-09 DIAGNOSIS — Z7982 Long term (current) use of aspirin: Secondary | ICD-10-CM | POA: Diagnosis not present

## 2018-07-09 DIAGNOSIS — T68XXXA Hypothermia, initial encounter: Secondary | ICD-10-CM | POA: Diagnosis present

## 2018-07-09 DIAGNOSIS — Y92009 Unspecified place in unspecified non-institutional (private) residence as the place of occurrence of the external cause: Secondary | ICD-10-CM | POA: Diagnosis not present

## 2018-07-09 DIAGNOSIS — Z23 Encounter for immunization: Secondary | ICD-10-CM | POA: Diagnosis not present

## 2018-07-09 DIAGNOSIS — N183 Chronic kidney disease, stage 3 (moderate): Secondary | ICD-10-CM | POA: Diagnosis present

## 2018-07-09 DIAGNOSIS — S81812A Laceration without foreign body, left lower leg, initial encounter: Secondary | ICD-10-CM | POA: Diagnosis present

## 2018-07-09 DIAGNOSIS — G9340 Encephalopathy, unspecified: Secondary | ICD-10-CM | POA: Diagnosis not present

## 2018-07-09 DIAGNOSIS — E860 Dehydration: Secondary | ICD-10-CM | POA: Diagnosis present

## 2018-07-09 DIAGNOSIS — M6281 Muscle weakness (generalized): Secondary | ICD-10-CM | POA: Diagnosis not present

## 2018-07-09 DIAGNOSIS — D631 Anemia in chronic kidney disease: Secondary | ICD-10-CM | POA: Diagnosis present

## 2018-07-09 DIAGNOSIS — I361 Nonrheumatic tricuspid (valve) insufficiency: Secondary | ICD-10-CM

## 2018-07-09 DIAGNOSIS — R627 Adult failure to thrive: Secondary | ICD-10-CM | POA: Diagnosis present

## 2018-07-09 DIAGNOSIS — J9811 Atelectasis: Secondary | ICD-10-CM | POA: Diagnosis present

## 2018-07-09 DIAGNOSIS — I129 Hypertensive chronic kidney disease with stage 1 through stage 4 chronic kidney disease, or unspecified chronic kidney disease: Secondary | ICD-10-CM | POA: Diagnosis present

## 2018-07-09 DIAGNOSIS — S0003XA Contusion of scalp, initial encounter: Secondary | ICD-10-CM | POA: Diagnosis present

## 2018-07-09 DIAGNOSIS — R609 Edema, unspecified: Secondary | ICD-10-CM

## 2018-07-09 DIAGNOSIS — R41841 Cognitive communication deficit: Secondary | ICD-10-CM | POA: Diagnosis not present

## 2018-07-09 DIAGNOSIS — Z7189 Other specified counseling: Secondary | ICD-10-CM | POA: Diagnosis not present

## 2018-07-09 DIAGNOSIS — M2550 Pain in unspecified joint: Secondary | ICD-10-CM | POA: Diagnosis not present

## 2018-07-09 DIAGNOSIS — F0151 Vascular dementia with behavioral disturbance: Secondary | ICD-10-CM | POA: Diagnosis not present

## 2018-07-09 DIAGNOSIS — R600 Localized edema: Secondary | ICD-10-CM | POA: Diagnosis present

## 2018-07-09 DIAGNOSIS — R410 Disorientation, unspecified: Secondary | ICD-10-CM | POA: Diagnosis not present

## 2018-07-09 DIAGNOSIS — Z7401 Bed confinement status: Secondary | ICD-10-CM | POA: Diagnosis not present

## 2018-07-09 LAB — TSH: TSH: 2.939 u[IU]/mL (ref 0.350–4.500)

## 2018-07-09 LAB — CBC
HCT: 31.2 % — ABNORMAL LOW (ref 36.0–46.0)
Hemoglobin: 9.7 g/dL — ABNORMAL LOW (ref 12.0–15.0)
MCH: 26.1 pg (ref 26.0–34.0)
MCHC: 31.1 g/dL (ref 30.0–36.0)
MCV: 84.1 fL (ref 80.0–100.0)
NRBC: 0 % (ref 0.0–0.2)
Platelets: 322 10*3/uL (ref 150–400)
RBC: 3.71 MIL/uL — ABNORMAL LOW (ref 3.87–5.11)
RDW: 14.1 % (ref 11.5–15.5)
WBC: 10.9 10*3/uL — ABNORMAL HIGH (ref 4.0–10.5)

## 2018-07-09 LAB — BASIC METABOLIC PANEL
Anion gap: 14 (ref 5–15)
BUN: 20 mg/dL (ref 8–23)
CO2: 21 mmol/L — ABNORMAL LOW (ref 22–32)
Calcium: 7.9 mg/dL — ABNORMAL LOW (ref 8.9–10.3)
Chloride: 103 mmol/L (ref 98–111)
Creatinine, Ser: 1.23 mg/dL — ABNORMAL HIGH (ref 0.44–1.00)
GFR calc Af Amer: 46 mL/min — ABNORMAL LOW (ref >60)
GFR calc non Af Amer: 39 mL/min — ABNORMAL LOW (ref >60)
Glucose, Bld: 146 mg/dL — ABNORMAL HIGH (ref 70–99)
POTASSIUM: 3.7 mmol/L (ref 3.5–5.1)
Sodium: 138 mmol/L (ref 135–145)

## 2018-07-09 LAB — ECHOCARDIOGRAM COMPLETE
Height: 63 in
Weight: 1848.34 oz

## 2018-07-09 LAB — CK: Total CK: 2602 U/L — ABNORMAL HIGH (ref 38–234)

## 2018-07-09 LAB — TROPONIN I
Troponin I: 1.39 ng/mL (ref <0.03)
Troponin I: 1.14 ng/mL (ref <0.03)
Troponin I: 0.96 ng/mL (ref <0.03)

## 2018-07-09 LAB — MAGNESIUM: Magnesium: 1.7 mg/dL (ref 1.7–2.4)

## 2018-07-09 MED ORDER — LORAZEPAM 2 MG/ML IJ SOLN
0.5000 mg | Freq: Once | INTRAMUSCULAR | Status: DC | PRN
  Filled 2018-07-09: qty 1

## 2018-07-09 MED ORDER — ENOXAPARIN SODIUM 30 MG/0.3ML ~~LOC~~ SOLN
30.0000 mg | SUBCUTANEOUS | Status: DC
  Administered 2018-07-10 – 2018-07-11 (×2): 30 mg via SUBCUTANEOUS
  Filled 2018-07-09 (×2): qty 0.3

## 2018-07-09 MED ORDER — SODIUM CHLORIDE 0.9 % IV SOLN
INTRAVENOUS | Status: AC
  Administered 2018-07-10: 09:00:00 via INTRAVENOUS

## 2018-07-09 NOTE — Progress Notes (Signed)
  Echocardiogram 2D Echocardiogram has been performed.  Delcie RochENNINGTON, Linnie Mcglocklin 07/09/2018, 3:27 PM

## 2018-07-09 NOTE — Progress Notes (Signed)
PROGRESS NOTE  Patricia Lynch VQW:[id number] DOB: [birth year] DOA: 07/08/2018 PCP: Leanna Battles, MD  HPI/Recap of past 24 hours: Patricia Lynch is a 84 y.o. female with history of hypertension, hyperlipidemia was brought to the ER after patient had a fall.  As per the patient's daughter patient had a fall about 2 days ago and has been lying on the floor since then.  Patient tripped onto her main door and was caught in between. She lacerated her right leg, with bleeding from the wound.  When patient's daughter went to check on her, pt was lying on the floor, conscious, weak and confused. In the ER patient was initially hypothermic which improved with bear hugger.  UA showing features concerning for UTI.  CK levels were around 3000 with mildly elevated troponin EKG showing normal sinus rhythm.  CT head and C-spine were unremarkable, chest x-ray was unremarkable. Pt admitted for further management.   Today, met pt sleeping, easily arousable. Denies any new complaints. Daughter at bedside, discussed in details about overall care. Also briefed her about code status and possibility of SNF. Of note, pt doesn't have a POA as per daughter.    Assessment/Plan: Principal Problem:   Acute encephalopathy Active Problems:   Non-traumatic rhabdomyolysis   Essential hypertension   Troponin level elevated   Hypothermia   Acute lower UTI  Acute metabolic encephalopathy Likely multifactorial, including hypothermia, UTI, dehydration Afebrile, resolving leukocytosis UA with large leukocytes, 21-50 WBC, few bacteria Chest x-ray, showed atelectasis no other acute infectious process CT head/cervical spine no acute abnormality MRI brain no acute intracranial abnormality Monitor closely  ?? UTI UA showed large leukocytes, 21-50 WBC, few bacteria UC pending Continue ceftriaxone, pending urine cultures  Rhabdomyolysis Likely secondary to fall CK 3163--> 2602 Continue IV fluids PT/OT Daily CK  Elevated  troponin 0.79-->1.39-->1.14 Denies any chest pain Likely due to above Echo pending Cardiology consulted  Laceration of the right leg Status post suture Monitor closely  CKD stage III Creatinine around baseline Daily BMP  Normocytic anemia likely due to CKD Hemoglobin around baseline Daily CBC  Hypertension Continue home losartan  Hyperlipidemia Hold statins due to rhabdomyolysis         Malnutrition Type:      Malnutrition Characteristics:      Nutrition Interventions:       Estimated body mass index is 20.46 kg/m as calculated from the following:   Height as of this encounter: '5\' 3"'  (1.6 m).   Weight as of this encounter: 52.4 kg.     Code Status: Full  Family Communication: Daughter at bedside  Disposition Plan: To be determined   Consultants:  Cardiology  Procedures:  None  Antimicrobials:  Ceftriaxone  DVT prophylaxis: Lovenox   Objective: Vitals:   07/09/18 0017 07/09/18 0355 07/09/18 0820 07/09/18 1215  BP: (!) 120/52 120/71 (!) 124/95 (!) 118/50  Pulse: 82 82 86 70  Resp: '20 17 18 16  ' Temp: 97.8 F (36.6 C) 98 F (36.7 C) 98.3 F (36.8 C) 98.6 F (37 C)  TempSrc: Oral Oral Axillary Axillary  SpO2: 98% 94% 100% 98%  Weight:      Height:       No intake or output data in the 24 hours ending 07/09/18 1233 Filed Weights   07/08/18 2032  Weight: 52.4 kg    Exam:   General: NAD, frail  Cardiovascular: S1, S2 present  Respiratory: Diminished BS bilaterally   Abdomen: Soft, NT, ND, BS present  Musculoskeletal: Trace bilateral LE  edema  Skin: Normal  Psychiatry: Unable to assess   Data Reviewed: CBC: Recent Labs  Lab 07/08/18 1627 07/09/18 0704  WBC 12.8* 10.9*  NEUTROABS 11.5*  --   HGB 10.9* 9.7*  HCT 34.2* 31.2*  MCV 85.3 84.1  PLT 309 160   Basic Metabolic Panel: Recent Labs  Lab 07/08/18 1627 07/08/18 2356 07/09/18 0704  NA 138  --  138  K 3.6  --  3.7  CL 104  --  103  CO2 24   --  21*  GLUCOSE 149*  --  146*  BUN 22  --  20  CREATININE 1.00  --  1.23*  CALCIUM 8.2*  --  7.9*  MG  --  1.7  --    GFR: Estimated Creatinine Clearance: 26.7 mL/min (A) (by C-G formula based on SCr of 1.23 mg/dL (H)). Liver Function Tests: Recent Labs  Lab 07/08/18 1627  AST 112*  ALT 51*  ALKPHOS 153*  BILITOT 1.0  PROT 5.3*  ALBUMIN 2.5*   No results for input(s): LIPASE, AMYLASE in the last 168 hours. No results for input(s): AMMONIA in the last 168 hours. Coagulation Profile: No results for input(s): INR, PROTIME in the last 168 hours. Cardiac Enzymes: Recent Labs  Lab 07/08/18 1627 07/08/18 2356 07/09/18 0704  CKTOTAL 3,163*  --  2,602*  TROPONINI 0.79* 1.39* 1.14*   BNP (last 3 results) No results for input(s): PROBNP in the last 8760 hours. HbA1C: No results for input(s): HGBA1C in the last 72 hours. CBG: No results for input(s): GLUCAP in the last 168 hours. Lipid Profile: No results for input(s): CHOL, HDL, LDLCALC, TRIG, CHOLHDL, LDLDIRECT in the last 72 hours. Thyroid Function Tests: Recent Labs    07/08/18 2356  TSH 2.939   Anemia Panel: No results for input(s): VITAMINB12, FOLATE, FERRITIN, TIBC, IRON, RETICCTPCT in the last 72 hours. Urine analysis:    Component Value Date/Time   COLORURINE YELLOW 07/08/2018 1922   APPEARANCEUR HAZY (A) 07/08/2018 1922   LABSPEC 1.019 07/08/2018 1922   PHURINE 5.0 07/08/2018 1922   GLUCOSEU 150 (A) 07/08/2018 1922   HGBUR MODERATE (A) 07/08/2018 [place] NEGATIVE 07/08/2018 1922   KETONESUR 20 (A) 07/08/2018 [place] NEGATIVE 07/08/2018 1922   NITRITE NEGATIVE 07/08/2018 1922   LEUKOCYTESUR LARGE (A) 07/08/2018 1922   Sepsis Labs: '@LABRCNTIP' (procalcitonin:4,lacticidven:4)  )No results found for this or any previous visit (from the past 240 hour(s)).    Studies: Dg Tibia/fibula Left  Result Date: 07/08/2018 CLINICAL DATA:  The patient suffered a large laceration of the right  lower leg due to a fall today. Initial encounter. EXAM: LEFT TIBIA AND FIBULA - 2 VIEW COMPARISON:  None. FINDINGS: Large skin defect is seen along the medial aspect of the lower leg consistent with laceration. No underlying fracture or foreign body. IMPRESSION: Laceration without underlying fracture or foreign body. Electronically Signed   By: Inge Rise M.D.   On: 07/08/2018 16:24   Dg Abd 1 View  Result Date: 07/09/2018 CLINICAL DATA:  Initial evaluation for MRI clearance. EXAM: ABDOMEN - 1 VIEW COMPARISON:  None. FINDINGS: Bowel gas pattern within normal limits without obstruction or ileus. No abnormal bowel wall thickening. No soft tissue mass or abnormal calcification. No metallic implants or other foreign bodies. Lumbar levoscoliosis with moderate to advanced multilevel degenerative spondylolysis. Osteoarthritic changes noted about the hips bilaterally, right greater than left. IMPRESSION: 1. No metallic implants within the abdomen. Patient cleared for MRI. 2. Nonobstructive  bowel gas pattern with no radiographic evidence for acute intra-abdominal process. Electronically Signed   By: Jeannine Boga M.D.   On: 07/09/2018 05:23   Ct Head Wo Contrast  Result Date: 07/08/2018 CLINICAL DATA:  Fall.  Ataxia. EXAM: CT HEAD WITHOUT CONTRAST CT CERVICAL SPINE WITHOUT CONTRAST TECHNIQUE: Multidetector CT imaging of the head and cervical spine was performed following the standard protocol without intravenous contrast. Multiplanar CT image reconstructions of the cervical spine were also generated. COMPARISON:  May 26, 2018 FINDINGS: CT HEAD FINDINGS Brain: No subdural, epidural, or subarachnoid hemorrhage. Ventricles and sulci are prominent but stable. Cerebellum, brainstem, and basal cisterns are normal. White matter changes identified. No acute cortical ischemia or infarct noted. No mass effect or midline shift. Vascular: No hyperdense vessel or unexpected calcification. Skull: Normal. Negative  for fracture or focal lesion. Sinuses/Orbits: No acute finding. Other: None. CT CERVICAL SPINE FINDINGS Alignment: Normal. Skull base and vertebrae: No acute fracture. No primary bone lesion or focal pathologic process. Soft tissues and spinal canal: No prevertebral fluid or swelling. No visible canal hematoma. Disc levels:  Multilevel degenerative disc disease. Upper chest: Negative. Other: No other abnormalities. IMPRESSION: 1. No acute intracranial abnormalities. 2. No fracture or traumatic malalignment in the cervical spine. Degenerative changes. Electronically Signed   By: Dorise Bullion III M.D   On: 07/08/2018 18:07   Ct Cervical Spine Wo Contrast  Result Date: 07/08/2018 CLINICAL DATA:  Fall.  Ataxia. EXAM: CT HEAD WITHOUT CONTRAST CT CERVICAL SPINE WITHOUT CONTRAST TECHNIQUE: Multidetector CT imaging of the head and cervical spine was performed following the standard protocol without intravenous contrast. Multiplanar CT image reconstructions of the cervical spine were also generated. COMPARISON:  May 26, 2018 FINDINGS: CT HEAD FINDINGS Brain: No subdural, epidural, or subarachnoid hemorrhage. Ventricles and sulci are prominent but stable. Cerebellum, brainstem, and basal cisterns are normal. White matter changes identified. No acute cortical ischemia or infarct noted. No mass effect or midline shift. Vascular: No hyperdense vessel or unexpected calcification. Skull: Normal. Negative for fracture or focal lesion. Sinuses/Orbits: No acute finding. Other: None. CT CERVICAL SPINE FINDINGS Alignment: Normal. Skull base and vertebrae: No acute fracture. No primary bone lesion or focal pathologic process. Soft tissues and spinal canal: No prevertebral fluid or swelling. No visible canal hematoma. Disc levels:  Multilevel degenerative disc disease. Upper chest: Negative. Other: No other abnormalities. IMPRESSION: 1. No acute intracranial abnormalities. 2. No fracture or traumatic malalignment in the  cervical spine. Degenerative changes. Electronically Signed   By: Dorise Bullion III M.D   On: 07/08/2018 18:07   Mr Brain Wo Contrast  Result Date: 07/09/2018 CLINICAL DATA:  84 year old female with increasing confusion. Fall yesterday with leg injury. EXAM: MRI HEAD WITHOUT CONTRAST TECHNIQUE: Multiplanar, multiecho pulse sequences of the brain and surrounding structures were obtained without intravenous contrast. COMPARISON:  Head and cervical spine CT 07/08/2018, 05/26/2018. FINDINGS: Brain: No restricted diffusion to suggest acute infarction. No midline shift, mass effect, evidence of mass lesion, ventriculomegaly, extra-axial collection or acute intracranial hemorrhage. Cervicomedullary junction and pituitary are within normal limits. Patchy and scattered cerebral white matter T2 and FLAIR hyperintensity, mild to moderate for age. No cortical encephalomalacia or chronic. Cerebral blood products identified the deep gray matter nuclei, brainstem, and cerebellum are normal for age. Vascular: Major intracranial vascular flow voids are preserved. Skull and upper cervical spine: Mild for age cervical spine degeneration. Normal bone marrow signal. Sinuses/Orbits: Postoperative changes to both globes, otherwise negative orbits. Paranasal sinuses and mastoids are stable  and well pneumatized. Other: Visible internal auditory structures appear normal. Scalp and face soft tissues appear negative. IMPRESSION: 1.  No acute intracranial abnormality. 2. Mild to moderate for age signal changes in the cerebral white matter, most commonly due to chronic small vessel disease. Electronically Signed   By: Genevie Ann M.D.   On: 07/09/2018 06:38   Dg Chest Port 1 View  Result Date: 07/08/2018 CLINICAL DATA:  Patient status post fall today. EXAM: PORTABLE CHEST 1 VIEW COMPARISON:  None. FINDINGS: There is a small left pleural effusion and mild left basilar atelectasis. The right lung is clear. No right effusion. No pneumothorax  on the right or left. Heart size is upper normal. Aortic atherosclerosis noted. No acute or focal bony abnormality. IMPRESSION: Small left pleural effusion and mild basilar airspace disease which is likely atelectasis rather than pneumonia. Atherosclerosis. Electronically Signed   By: Inge Rise M.D.   On: 07/08/2018 16:23   Vas [place] Lower Extremity Venous (dvt)  Result Date: 07/09/2018  Lower Venous Study Indications: Edema.  Performing Technologist: Abram Sander RVS  Examination Guidelines: A complete evaluation includes B-mode imaging, spectral Doppler, color Doppler, and power Doppler as needed of all accessible portions of each vessel. Bilateral testing is considered an integral part of a complete examination. Limited examinations for reoccurring indications may be performed as noted.  Right Venous Findings: +---------+---------------+---------+-----------+----------+--------------+          CompressibilityPhasicitySpontaneityPropertiesSummary        +---------+---------------+---------+-----------+----------+--------------+ CFV      Full           Yes      Yes                                 +---------+---------------+---------+-----------+----------+--------------+ SFJ      Full                                                        +---------+---------------+---------+-----------+----------+--------------+ FV Prox  Full                                                        +---------+---------------+---------+-----------+----------+--------------+ FV Mid   Full                                                        +---------+---------------+---------+-----------+----------+--------------+ FV DistalFull                                                        +---------+---------------+---------+-----------+----------+--------------+ PFV      Full                                                         +---------+---------------+---------+-----------+----------+--------------+  POP      Full           Yes      Yes                                 +---------+---------------+---------+-----------+----------+--------------+ PTV      Full                                                        +---------+---------------+---------+-----------+----------+--------------+ PERO                                                  not visualized +---------+---------------+---------+-----------+----------+--------------+  Left Venous Findings: +---------+---------------+---------+-----------+----------+--------------+          CompressibilityPhasicitySpontaneityPropertiesSummary        +---------+---------------+---------+-----------+----------+--------------+ CFV      Full           Yes      Yes                                 +---------+---------------+---------+-----------+----------+--------------+ SFJ      Full                                                        +---------+---------------+---------+-----------+----------+--------------+ FV Prox  Full                                                        +---------+---------------+---------+-----------+----------+--------------+ FV Mid   Full                                                        +---------+---------------+---------+-----------+----------+--------------+ FV DistalFull                                                        +---------+---------------+---------+-----------+----------+--------------+ PFV      Full                                                        +---------+---------------+---------+-----------+----------+--------------+ POP      Full           Yes      Yes                                 +---------+---------------+---------+-----------+----------+--------------+  PTV      Full                                                         +---------+---------------+---------+-----------+----------+--------------+ PERO                                                  not visualized +---------+---------------+---------+-----------+----------+--------------+    Summary: Right: There is no evidence of deep vein thrombosis in the lower extremity. No cystic structure found in the popliteal fossa. Left: There is no evidence of deep vein thrombosis in the lower extremity. No cystic structure found in the popliteal fossa.  *See table(s) above for measurements and observations.    Preliminary     Scheduled Meds: . aspirin EC  81 mg Oral Daily  . enoxaparin (LOVENOX) injection  30 mg Subcutaneous Q24H  . losartan  50 mg Oral QHS    Continuous Infusions: . sodium chloride 100 mL/hr at 07/09/18 0659  . cefTRIAXone (ROCEPHIN)  IV 1 g (07/09/18 0042)     LOS: 0 days     Alma Friendly, MD [facility]  If 7PM-7AM, please contact night-coverage [email or web address] 07/09/2018, 12:33 PM

## 2018-07-09 NOTE — Progress Notes (Signed)
ANTICOAGULATION CONSULT NOTE - Initial Consult  Pharmacy Consult for lovenox Indication: VTE prophylaxis  Allergies  Allergen Reactions  . Beef-Derived Products Nausea Only  . Other Nausea Only    Some vegetables    Patient Measurements: Height: 5\' 3"  (160 cm) Weight: 115 lb 8.3 oz (52.4 kg) IBW/kg (Calculated) : 52.4 Heparin Dosing Weight: 52.4 kg  Vital Signs: Temp: 98 F (36.7 C) (12/02 0355) Temp Source: Oral (12/02 0355) BP: 120/71 (12/02 0355) Pulse Rate: 82 (12/02 0355)  Labs: Recent Labs    07/08/18 1627 07/08/18 2356  HGB 10.9*  --   HCT 34.2*  --   PLT 309  --   CREATININE 1.00  --   CKTOTAL 3,163*  --   TROPONINI 0.79* 1.39*    Estimated Creatinine Clearance: 32.8 mL/min (by C-G formula based on SCr of 1 mg/dL).   Medical History: Past Medical History:  Diagnosis Date  . Hyperlipidemia   . Hypertension     Medications:  See medication history  Assessment: 84 yo lady to start lovenox for VTE px.  Due to low body weight and CrCl 32 ml/min will give lower dose of lovenox Goal of Therapy:  Prevention of VTE Monitor platelets by anticoagulation protocol: Yes   Plan:  Lovenox 30 mg sq q24 hours Monitor for bleeding complications  Rheda Kassab Poteet 07/09/2018,6:27 AM

## 2018-07-09 NOTE — Progress Notes (Signed)
PT Cancellation Note  Patient Details Name: Gay FillerMary Moquin MRN: [id number] DOB: 1928-10-07   Cancelled Treatment:    Reason Eval/Treat Not Completed: Patient at procedure or test/unavailable Will follow as time allows.   Kipp LaurenceStephanie R Barbi Kumagai, PT, DPT Supplemental Physical Therapist 07/09/18 9:50 AM Pager: [phone] Office: [phone]

## 2018-07-09 NOTE — Progress Notes (Signed)
Bilateral lower extremity venous duplex completed.  Preliminary results in CV proc.   Blanch MediaMegan Rowyn Spilde 07/09/2018 10:08 AM

## 2018-07-09 NOTE — Consult Note (Signed)
Cardiology Consultation:   Patient ID: Patricia Lynch; [id number]; March 05, 1929   Admit date: 07/08/2018 Date of Consult: 07/09/2018  Primary Care Provider: Jarome Matin, MD Primary Cardiologist: New Primary Electrophysiologist:  n/a   Patient Profile:   Patricia Lynch is a 84 y.o. female with a hx of HTN, HLD, dementia, who is being seen today for the evaluation of elevated troponin at the request of Dr Toniann Fail.  History of Present Illness:   Patricia Lynch was admitted 10/19-10/21/2019 after a mechanical fall, on the floor several hours. Dx Rhabdo, scalp hematoma. D/c home w/ walker, S.W., PT  Admitted 12/01 after a fall, on the floor over 24 hr. CK 3163 (MB not done), troponin peak 1.39, cards asked to see.   Patricia Lynch is not able to give much information.  She is oriented to name, admits she does not know where she is.  Information is obtained from chart notes and staff.  Her daughter helps in her care, but is not present.  She denies pain and states she has not had chest pain.  Her mobility is extremely limited.  Even lying in the bed, she cannot move herself hardly at all.   Past Medical History:  Diagnosis Date  . Dementia (HCC)   . Hyperlipidemia   . Hypertension     History reviewed. No pertinent surgical history.   Prior to Admission medications   Medication Sig Start Date End Date Taking? Authorizing Provider  acetaminophen (TYLENOL) 500 MG tablet Take by mouth every 6 (six) hours as needed for moderate pain.   Yes [provider]  losartan (COZAAR) 50 MG tablet Take 50 mg by mouth at bedtime. 04/14/18  Yes [provider]  lovastatin (MEVACOR) 10 MG tablet Take 10 mg by mouth daily. 04/14/18  Yes [provider]  Menthol, Topical Analgesic, (BIOFREEZE EX) Apply 1 application topically daily as needed (back pain).   Yes [provider]    Inpatient Medications: Scheduled Meds: . aspirin EC  81 mg Oral Daily  . enoxaparin (LOVENOX) injection   30 mg Subcutaneous Q24H  . losartan  50 mg Oral QHS   Continuous Infusions: . sodium chloride 100 mL/hr at 07/09/18 1328  . cefTRIAXone (ROCEPHIN)  IV 1 g (07/09/18 0042)   PRN Meds: acetaminophen **OR** acetaminophen, LORazepam, ondansetron **OR** ondansetron (ZOFRAN) IV  Allergies:    Allergies  Allergen Reactions  . Beef-Derived Products Nausea Only  . Other Nausea Only    Some vegetables    Social History: No other info available due to patient condition Social History   Socioeconomic History  . Marital status: Widowed    Spouse name: Not on file  . Number of children: Not on file  . Years of education: Not on file  . Highest education level: Not on file  Occupational History  . Occupation: Retired  Engineer, production  . Financial resource strain: Not on file  . Food insecurity:    Worry: Not on file    Inability: Not on file  . Transportation needs:    Medical: Not on file    Non-medical: Not on file  Tobacco Use  . Smoking status: Never Smoker  . Smokeless tobacco: Never Used  Substance and Sexual Activity  . Alcohol use: Not on file  . Drug use: Not on file  . Sexual activity: Not on file  Lifestyle  . Physical activity:    Days per week: Not on file    Minutes per session: Not on file  .  Stress: Not on file  Relationships  . Social connections:    Talks on phone: Not on file    Gets together: Not on file    Attends religious service: Not on file    Active member of club or organization: Not on file    Attends meetings of clubs or organizations: Not on file    Relationship status: Not on file  . Intimate partner violence:    Fear of current or ex partner: Not on file    Emotionally abused: Not on file    Physically abused: Not on file    Forced sexual activity: Not on file  Other Topics Concern  . Not on file  Social History Narrative   Patient living alone, daughter helps in her care.    Family History:   Family History  Family history unknown:  Yes   Family Status:  No family status information on file.    ROS:  No other info available due to patient condition  Physical Exam/Data:   Vitals:   07/09/18 0017 07/09/18 0355 07/09/18 0820 07/09/18 1215  BP: (!) 120/52 120/71 (!) 124/95 (!) 118/50  Pulse: 82 82 86 70  Resp: 20 17 18 16   Temp: 97.8 F (36.6 C) 98 F (36.7 C) 98.3 F (36.8 C) 98.6 F (37 C)  TempSrc: Oral Oral Axillary Axillary  SpO2: 98% 94% 100% 98%  Weight:      Height:       No intake or output data in the 24 hours ending 07/09/18 1405 Filed Weights   07/08/18 2032  Weight: 52.4 kg   Body mass index is 20.46 kg/m.  General: Frail elderly female, in no acute distress HEENT: normal for age Lymph: no adenopathy Neck: no JVD, head is tilted back and patient is unable to lift Endocrine:  No thryomegaly Vascular: No carotid bruits; 4/4 extremity pulses 2+, without bruits  Cardiac:  normal S1, S2; RRR; no murmur  Lungs:  clear to auscultation bilaterally, no wheezing, rhonchi or rales  Abd: soft, nontender, no hepatomegaly  Ext: Trace lower extremity edema Musculoskeletal: She has no obvious deformities, no joint effusions.  She is not able to move much at all. Skin: warm and dry  Neuro:  CNs 2-12 intact, all movements are extremely limited, no facial droop is noted  EKG:  The EKG was personally reviewed and demonstrates:  SR, HR 76. No acute ischemic changes. Long QT/QTc are old and no sig change Telemetry:  Telemetry was personally reviewed and demonstrates:  SR  Relevant CV Studies:  ECHO: ordered  Laboratory Data:  Chemistry Recent Labs  Lab 07/08/18 1627 07/09/18 0704  NA 138 138  K 3.6 3.7  CL 104 103  CO2 24 21*  GLUCOSE 149* 146*  BUN 22 20  CREATININE 1.00 1.23*  CALCIUM 8.2* 7.9*  GFRNONAA 51* 39*  GFRAA 59* 46*  ANIONGAP 10 14    Lab Results  Component Value Date   ALT 51 (H) 07/08/2018   AST 112 (H) 07/08/2018   ALKPHOS 153 (H) 07/08/2018   BILITOT 1.0  07/08/2018   Hematology Recent Labs  Lab 07/08/18 1627 07/09/18 0704  WBC 12.8* 10.9*  RBC 4.01 3.71*  HGB 10.9* 9.7*  HCT 34.2* 31.2*  MCV 85.3 84.1  MCH 27.2 26.1  MCHC 31.9 31.1  RDW 14.0 14.1  PLT 309 322   Cardiac Enzymes Recent Labs  Lab 07/08/18 1627 07/08/18 2356 07/09/18 0704 07/09/18 1156  TROPONINI 0.79* 1.39* 1.14* 0.96*  Recent Labs  Lab 07/08/18 1949  TROPIPOC 0.98*    TSH:  Lab Results  Component Value Date   TSH 2.939 07/08/2018   Magnesium:  Magnesium  Date Value Ref Range Status  07/08/2018 1.7 1.7 - 2.4 mg/dL Final    Comment:    Performed at [facility], [address] [place], [place] [address]     Radiology/Studies:  Dg Tibia/fibula Left  Result Date: 07/08/2018 CLINICAL DATA:  The patient suffered a large laceration of the right lower leg due to a fall today. Initial encounter. EXAM: LEFT TIBIA AND FIBULA - 2 VIEW COMPARISON:  None. FINDINGS: Large skin defect is seen along the medial aspect of the lower leg consistent with laceration. No underlying fracture or foreign body. IMPRESSION: Laceration without underlying fracture or foreign body. Electronically Signed   By: Drusilla Kannerhomas  Dalessio M.D.   On: 07/08/2018 16:24   Dg Abd 1 View  Result Date: 07/09/2018 CLINICAL DATA:  Initial evaluation for MRI clearance. EXAM: ABDOMEN - 1 VIEW COMPARISON:  None. FINDINGS: Bowel gas pattern within normal limits without obstruction or ileus. No abnormal bowel wall thickening. No soft tissue mass or abnormal calcification. No metallic implants or other foreign bodies. Lumbar levoscoliosis with moderate to advanced multilevel degenerative spondylolysis. Osteoarthritic changes noted about the hips bilaterally, right greater than left. IMPRESSION: 1. No metallic implants within the abdomen. Patient cleared for MRI. 2. Nonobstructive bowel gas pattern with no radiographic evidence for acute intra-abdominal process. Electronically Signed   By: Rise MuBenjamin   McClintock M.D.   On: 07/09/2018 05:23   Ct Head Wo Contrast  Result Date: 07/08/2018 CLINICAL DATA:  Fall.  Ataxia. EXAM: CT HEAD WITHOUT CONTRAST CT CERVICAL SPINE WITHOUT CONTRAST TECHNIQUE: Multidetector CT imaging of the head and cervical spine was performed following the standard protocol without intravenous contrast. Multiplanar CT image reconstructions of the cervical spine were also generated. COMPARISON:  May 26, 2018 FINDINGS: CT HEAD FINDINGS Brain: No subdural, epidural, or subarachnoid hemorrhage. Ventricles and sulci are prominent but stable. Cerebellum, brainstem, and basal cisterns are normal. White matter changes identified. No acute cortical ischemia or infarct noted. No mass effect or midline shift. Vascular: No hyperdense vessel or unexpected calcification. Skull: Normal. Negative for fracture or focal lesion. Sinuses/Orbits: No acute finding. Other: None. CT CERVICAL SPINE FINDINGS Alignment: Normal. Skull base and vertebrae: No acute fracture. No primary bone lesion or focal pathologic process. Soft tissues and spinal canal: No prevertebral fluid or swelling. No visible canal hematoma. Disc levels:  Multilevel degenerative disc disease. Upper chest: Negative. Other: No other abnormalities. IMPRESSION: 1. No acute intracranial abnormalities. 2. No fracture or traumatic malalignment in the cervical spine. Degenerative changes. Electronically Signed   By: Gerome Samavid  Williams III M.D   On: 07/08/2018 18:07   Ct Cervical Spine Wo Contrast  Result Date: 07/08/2018 CLINICAL DATA:  Fall.  Ataxia. EXAM: CT HEAD WITHOUT CONTRAST CT CERVICAL SPINE WITHOUT CONTRAST TECHNIQUE: Multidetector CT imaging of the head and cervical spine was performed following the standard protocol without intravenous contrast. Multiplanar CT image reconstructions of the cervical spine were also generated. COMPARISON:  May 26, 2018 FINDINGS: CT HEAD FINDINGS Brain: No subdural, epidural, or subarachnoid hemorrhage.  Ventricles and sulci are prominent but stable. Cerebellum, brainstem, and basal cisterns are normal. White matter changes identified. No acute cortical ischemia or infarct noted. No mass effect or midline shift. Vascular: No hyperdense vessel or unexpected calcification. Skull: Normal. Negative for fracture or focal lesion. Sinuses/Orbits: No acute finding.  Other: None. CT CERVICAL SPINE FINDINGS Alignment: Normal. Skull base and vertebrae: No acute fracture. No primary bone lesion or focal pathologic process. Soft tissues and spinal canal: No prevertebral fluid or swelling. No visible canal hematoma. Disc levels:  Multilevel degenerative disc disease. Upper chest: Negative. Other: No other abnormalities. IMPRESSION: 1. No acute intracranial abnormalities. 2. No fracture or traumatic malalignment in the cervical spine. Degenerative changes. Electronically Signed   By: Gerome Sam III M.D   On: 07/08/2018 18:07   Mr Brain Wo Contrast  Result Date: 07/09/2018 CLINICAL DATA:  84 year old female with increasing confusion. Fall yesterday with leg injury. EXAM: MRI HEAD WITHOUT CONTRAST TECHNIQUE: Multiplanar, multiecho pulse sequences of the brain and surrounding structures were obtained without intravenous contrast. COMPARISON:  Head and cervical spine CT 07/08/2018, 05/26/2018. FINDINGS: Brain: No restricted diffusion to suggest acute infarction. No midline shift, mass effect, evidence of mass lesion, ventriculomegaly, extra-axial collection or acute intracranial hemorrhage. Cervicomedullary junction and pituitary are within normal limits. Patchy and scattered cerebral white matter T2 and FLAIR hyperintensity, mild to moderate for age. No cortical encephalomalacia or chronic. Cerebral blood products identified the deep gray matter nuclei, brainstem, and cerebellum are normal for age. Vascular: Major intracranial vascular flow voids are preserved. Skull and upper cervical spine: Mild for age cervical spine  degeneration. Normal bone marrow signal. Sinuses/Orbits: Postoperative changes to both globes, otherwise negative orbits. Paranasal sinuses and mastoids are stable and well pneumatized. Other: Visible internal auditory structures appear normal. Scalp and face soft tissues appear negative. IMPRESSION: 1.  No acute intracranial abnormality. 2. Mild to moderate for age signal changes in the cerebral white matter, most commonly due to chronic small vessel disease. Electronically Signed   By: Odessa Fleming M.D.   On: 07/09/2018 06:38   Dg Chest Port 1 View  Result Date: 07/08/2018 CLINICAL DATA:  Patient status post fall today. EXAM: PORTABLE CHEST 1 VIEW COMPARISON:  None. FINDINGS: There is a small left pleural effusion and mild left basilar atelectasis. The right lung is clear. No right effusion. No pneumothorax on the right or left. Heart size is upper normal. Aortic atherosclerosis noted. No acute or focal bony abnormality. IMPRESSION: Small left pleural effusion and mild basilar airspace disease which is likely atelectasis rather than pneumonia. Atherosclerosis. Electronically Signed   By: Drusilla Kanner M.D.   On: 07/08/2018 16:23   Vas [place] Lower Extremity Venous (dvt)  Result Date: 07/09/2018  Lower Venous Study Indications: Edema.  Performing Technologist: Blanch Media RVS  Examination Guidelines: A complete evaluation includes B-mode imaging, spectral Doppler, color Doppler, and power Doppler as needed of all accessible portions of each vessel. Bilateral testing is considered an integral part of a complete examination. Limited examinations for reoccurring indications may be performed as noted.  Right Venous Findings: +---------+---------------+---------+-----------+----------+--------------+          CompressibilityPhasicitySpontaneityPropertiesSummary        +---------+---------------+---------+-----------+----------+--------------+ CFV      Full           Yes      Yes                                  +---------+---------------+---------+-----------+----------+--------------+ SFJ      Full                                                        +---------+---------------+---------+-----------+----------+--------------+  FV Prox  Full                                                        +---------+---------------+---------+-----------+----------+--------------+ FV Mid   Full                                                        +---------+---------------+---------+-----------+----------+--------------+ FV DistalFull                                                        +---------+---------------+---------+-----------+----------+--------------+ PFV      Full                                                        +---------+---------------+---------+-----------+----------+--------------+ POP      Full           Yes      Yes                                 +---------+---------------+---------+-----------+----------+--------------+ PTV      Full                                                        +---------+---------------+---------+-----------+----------+--------------+ PERO                                                  not visualized +---------+---------------+---------+-----------+----------+--------------+  Left Venous Findings: +---------+---------------+---------+-----------+----------+--------------+          CompressibilityPhasicitySpontaneityPropertiesSummary        +---------+---------------+---------+-----------+----------+--------------+ CFV      Full           Yes      Yes                                 +---------+---------------+---------+-----------+----------+--------------+ SFJ      Full                                                        +---------+---------------+---------+-----------+----------+--------------+ FV Prox  Full                                                         +---------+---------------+---------+-----------+----------+--------------+  FV Mid   Full                                                        +---------+---------------+---------+-----------+----------+--------------+ FV DistalFull                                                        +---------+---------------+---------+-----------+----------+--------------+ PFV      Full                                                        +---------+---------------+---------+-----------+----------+--------------+ POP      Full           Yes      Yes                                 +---------+---------------+---------+-----------+----------+--------------+ PTV      Full                                                        +---------+---------------+---------+-----------+----------+--------------+ PERO                                                  not visualized +---------+---------------+---------+-----------+----------+--------------+    Summary: Right: There is no evidence of deep vein thrombosis in the lower extremity. No cystic structure found in the popliteal fossa. Left: There is no evidence of deep vein thrombosis in the lower extremity. No cystic structure found in the popliteal fossa.  *See table(s) above for measurements and observations.    Preliminary     Assessment and Plan:   1. Elevated troponin -No reports of chest pain and her ECG is not acute - Troponin elevation of 1.36 is of unclear significance in a patient with a CK of greater than 3000 -Follow-up on echocardiogram results - Favor conservative management in this extremely frail patient who is asymptomatic  Otherwise, per IM Principal Problem:   Acute encephalopathy Active Problems:   Non-traumatic rhabdomyolysis   Essential hypertension   Troponin level elevated   Hypothermia   Acute lower UTI   Dementia (HCC)     For questions or updates, please contact [facility] Please  consult [email or web address] for contact info under Cardiology/STEMI.   SignedTheodore Demark, PA-C  07/09/2018 2:05 PM

## 2018-07-09 NOTE — Progress Notes (Addendum)
This Publishing copywriter notified Covering provider regarding JVD. Provider ordered Doppler study. Results pending. Inaddition this writer noticed  Pt did not void yet today. Bladder scanned and 350 ml retained in bladder. Awaiting call back and new orders. Family is currenlty at bedside. Will continue to monitor for safey.  Provider ordered a straight Cath. This writer emptied 300 ML of foul smelling cloudy urine. out of bladder. Provider was notified and is aware of situation. Cultures are pending.

## 2018-07-10 DIAGNOSIS — Z7189 Other specified counseling: Secondary | ICD-10-CM

## 2018-07-10 DIAGNOSIS — F0150 Vascular dementia without behavioral disturbance: Secondary | ICD-10-CM

## 2018-07-10 DIAGNOSIS — L8990 Pressure ulcer of unspecified site, unspecified stage: Secondary | ICD-10-CM

## 2018-07-10 DIAGNOSIS — Z515 Encounter for palliative care: Secondary | ICD-10-CM

## 2018-07-10 DIAGNOSIS — I10 Essential (primary) hypertension: Secondary | ICD-10-CM

## 2018-07-10 LAB — BASIC METABOLIC PANEL
Anion gap: 9 (ref 5–15)
BUN: 13 mg/dL (ref 8–23)
CALCIUM: 7.7 mg/dL — AB (ref 8.9–10.3)
CO2: 23 mmol/L (ref 22–32)
Chloride: 106 mmol/L (ref 98–111)
Creatinine, Ser: 1.07 mg/dL — ABNORMAL HIGH (ref 0.44–1.00)
GFR calc Af Amer: 54 mL/min — ABNORMAL LOW (ref >60)
GFR, EST NON AFRICAN AMERICAN: 47 mL/min — AB (ref >60)
Glucose, Bld: 106 mg/dL — ABNORMAL HIGH (ref 70–99)
Potassium: 3.1 mmol/L — ABNORMAL LOW (ref 3.5–5.1)
Sodium: 138 mmol/L (ref 135–145)

## 2018-07-10 LAB — URINE CULTURE: Culture: NO GROWTH

## 2018-07-10 LAB — CBC WITH DIFFERENTIAL/PLATELET
Abs Immature Granulocytes: 0.02 10*3/uL (ref 0.00–0.07)
Basophils Absolute: 0 10*3/uL (ref 0.0–0.1)
Basophils Relative: 0 %
Eosinophils Absolute: 0.1 10*3/uL (ref 0.0–0.5)
Eosinophils Relative: 1 %
HCT: 30.2 % — ABNORMAL LOW (ref 36.0–46.0)
Hemoglobin: 9.5 g/dL — ABNORMAL LOW (ref 12.0–15.0)
Immature Granulocytes: 0 %
Lymphocytes Relative: 18 %
Lymphs Abs: 1.4 10*3/uL (ref 0.7–4.0)
MCH: 26.5 pg (ref 26.0–34.0)
MCHC: 31.5 g/dL (ref 30.0–36.0)
MCV: 84.4 fL (ref 80.0–100.0)
MONO ABS: 0.8 10*3/uL (ref 0.1–1.0)
Monocytes Relative: 9 %
Neutro Abs: 5.8 10*3/uL (ref 1.7–7.7)
Neutrophils Relative %: 72 %
Platelets: 327 10*3/uL (ref 150–400)
RBC: 3.58 MIL/uL — ABNORMAL LOW (ref 3.87–5.11)
RDW: 14.5 % (ref 11.5–15.5)
WBC: 8.1 10*3/uL (ref 4.0–10.5)
nRBC: 0 % (ref 0.0–0.2)

## 2018-07-10 LAB — CK: CK TOTAL: 949 U/L — AB (ref 38–234)

## 2018-07-10 MED ORDER — MUPIROCIN 2 % EX OINT
TOPICAL_OINTMENT | Freq: Every day | CUTANEOUS | Status: DC
  Administered 2018-07-10 – 2018-07-11 (×2): via TOPICAL
  Filled 2018-07-10: qty 22

## 2018-07-10 MED ORDER — MORPHINE SULFATE (PF) 2 MG/ML IV SOLN
1.0000 mg | INTRAVENOUS | Status: AC
  Administered 2018-07-10: 1 mg via INTRAVENOUS
  Filled 2018-07-10: qty 1

## 2018-07-10 MED ORDER — MORPHINE SULFATE (PF) 2 MG/ML IV SOLN
1.0000 mg | INTRAVENOUS | Status: DC | PRN
  Administered 2018-07-12 (×3): 1 mg via INTRAVENOUS
  Filled 2018-07-10 (×3): qty 1

## 2018-07-10 NOTE — Consult Note (Signed)
Consultation Note Date: 07/10/2018   Patient Name: Patricia Lynch  DOB: 15-Aug-1928  MRN: [id number]  Age / Sex: 84 y.o., female  PCP: Leanna Battles, MD Referring Physician: Mendel Corning, MD  Reason for Consultation: Establishing goals of care  HPI/Patient Profile: 84 y.o. female  with past medical history of dementia, frequent falls, HLD, HTN, CKD, and anemia admitted on 07/08/2018 after a fall at home. She lacerated her right leg.   CK levels were around 3000 with mildly elevated troponin. EKG showing normal sinus rhythm. Echo revealed normal systolic function. CT head and C-spine were unremarkable, chest x-ray was unremarkable. PMT consulted for [place] d/t dementia and frequent falls.   Clinical Assessment and Goals of Care: I have reviewed medical records including EPIC notes, labs and imaging, assessed the patient and then met with patient and her daughter, Patricia Lynch,  to discuss diagnosis prognosis, GOC, EOL wishes, disposition and options.  I first met patient alone. She is oriented x4 with periods of confusion. She tells me her ankle hurts. She gives me permission to call daughter. Daughter later came to room so we could discuss goals of care together.   I introduced Palliative Medicine as specialized medical care for people living with serious illness. It focuses on providing relief from the symptoms and stress of a serious illness. The goal is to improve quality of life for both the patient and the family.  We discussed a brief life review of the patient. Patient worked as a Aeronautical engineer. Her husband passed away 25 years ago. She has lived in [place] most of her life.   As far as functional and nutritional status, she tells me she is independent in ADLs. Daughter tells me she takes sponge baths and should be using a walker but does not. Daughter tells me of patient's forgetfulness.    We discussed her current illness and what it means in  the larger context of her on-going co-morbidities.   I attempted to elicit values and goals of care important to the patient.    The difference between aggressive medical intervention and comfort care was considered in light of the patient's goals of care.   Advance directives, concepts specific to code status, artifical feeding and hydration, and rehospitalization were considered and discussed. Thorough discussion of code status and patient elected DNR - daughter agrees.   Hospice and Palliative Care services outpatient were explained and offered. Patient is not yet eligible for hospice care, but agrees to outpatient palliative.  Daughter is concerned about patient returning home - we discussed rehab which patient agrees to if recommended by PT.   Patient c/o severe left ankle pain during conversation. Was given tylenol earlier with no relief. Gave 1 mg morphine - tolerated well and experienced relief.   Questions and concerns were addressed.  Hard Choices booklet left for review. The family was encouraged to call with questions or concerns.   Primary Decision Maker NEXT OF KIN - daughter Patricia Lynch    SUMMARY OF RECOMMENDATIONS   - Code status changed to DNR per patient/family wishes - Daughter cannot provide 24 hr supervision - thinks rehab will be needed - awaiting PT eval - will add prn morphine for pain for breakthrough pain in addition to tylenol - discharging MD please write for outpatient palliative in discharge summary - not yet hospice appropriate  Code Status/Advance Care Planning:  DNR  Palliative Prophylaxis:   Frequent Pain Assessment  Prognosis:   Unable to determine  Discharge Planning: Skilled  Nursing Facility for rehab with Palliative care service follow-up      Primary Diagnoses: Present on Admission: . Acute encephalopathy . Essential hypertension . Acute lower UTI . Dementia ([place])   I have reviewed the medical record, interviewed the patient and  family, and examined the patient. The following aspects are pertinent.  Past Medical History:  Diagnosis Date  . Dementia ([place])   . Hyperlipidemia   . Hypertension    Social History   Socioeconomic History  . Marital status: Widowed    Spouse name: Not on file  . Number of children: Not on file  . Years of education: Not on file  . Highest education level: Not on file  Occupational History  . Occupation: Retired  Scientific laboratory technician  . Financial resource strain: Not on file  . Food insecurity:    Worry: Not on file    Inability: Not on file  . Transportation needs:    Medical: Not on file    Non-medical: Not on file  Tobacco Use  . Smoking status: Never Smoker  . Smokeless tobacco: Never Used  Substance and Sexual Activity  . Alcohol use: Not on file  . Drug use: Not on file  . Sexual activity: Not on file  Lifestyle  . Physical activity:    Days per week: Not on file    Minutes per session: Not on file  . Stress: Not on file  Relationships  . Social connections:    Talks on phone: Not on file    Gets together: Not on file    Attends religious service: Not on file    Active member of club or organization: Not on file    Attends meetings of clubs or organizations: Not on file    Relationship status: Not on file  Other Topics Concern  . Not on file  Social History Narrative   Patient living alone, daughter helps in her care.   Family History  Family history unknown: Yes   Scheduled Meds: . aspirin EC  81 mg Oral Daily  . enoxaparin (LOVENOX) injection  30 mg Subcutaneous Q24H  . losartan  50 mg Oral QHS  . mupirocin ointment   Topical Daily   Continuous Infusions: . sodium chloride 50 mL/hr at 07/10/18 0919  . cefTRIAXone (ROCEPHIN)  IV 1 g (07/09/18 2349)   PRN Meds:.acetaminophen **OR** acetaminophen, LORazepam, ondansetron **OR** ondansetron (ZOFRAN) IV Allergies  Allergen Reactions  . Beef-Derived Products Nausea Only  . Other Nausea Only    Some  vegetables   Review of Systems  Musculoskeletal:       LEFT ankle pain, no right leg pain  All other systems reviewed and are negative.   Physical Exam  Constitutional: She is oriented to person, place, and time. No distress.  HENT:  Head: Normocephalic and atraumatic.  Cardiovascular: Normal rate and regular rhythm.  Pulmonary/Chest: Effort normal and breath sounds normal. No respiratory distress.  Abdominal: Soft. Bowel sounds are normal.  Musculoskeletal:  Mild BLE edema, R ankle wrapped in gauze  Neurological: She is alert and oriented to person, place, and time.  Skin: Skin is warm and dry. She is not diaphoretic.  Psychiatric: Cognition and memory are impaired.    Vital Signs: BP (!) 131/57 (BP Location: Right Arm)   Pulse 72   Temp (!) 97.4 F (36.3 C) (Oral)   Resp 20   Ht '5\' 3"'  (1.6 m)   Wt 52.4 kg   SpO2 97%   BMI 20.46  kg/m  Pain Scale: 0-10   Pain Score: 0-No pain   SpO2: SpO2: 97 % O2 Device:SpO2: 97 % O2 Flow Rate: .   IO: Intake/output summary:   Intake/Output Summary (Last 24 hours) at 07/10/2018 1017 Last data filed at 07/10/2018 0400 Gross per 24 hour  Intake 1250 ml  Output 550 ml  Net 700 ml    LBM: Last BM Date: 07/09/18 Baseline Weight: Weight: 52.4 kg Most recent weight: Weight: 52.4 kg     Palliative Assessment/Data: PPS 50%    Time Total: 70 minutes Greater than 50%  of this time was spent counseling and coordinating care related to the above assessment and plan.  Juel Burrow, DNP, AGNP-C Palliative Medicine Team [phone] Pager: [phone]

## 2018-07-10 NOTE — Progress Notes (Signed)
Pt assisted unto the BSC, voided 200cc of cloudy urine, assisted back to bed and a post void bladder scan done, scanned about 57cc, peri care done and pt made comfortable in bed, will continue to monitor. Ob[place]-Asidi, Solomon Skowronek Efe

## 2018-07-10 NOTE — Consult Note (Signed)
WOC Nurse wound consult note Reason for Consult:Trauma after fall at home with prolonged down time.  Laceration to left anterior lower leg with sutures in place and nonapproximated edges.   Left medial malleolus with puncture trauma wound from fall.  Partial thickness abrasion to left hip from fall.  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  Wound type: Trauma and pressure.  Pressure Injury POA: Yes Measurement:Coccyx:  1 cm x 0.5 cm x 0.3 cm  Left anterior leg:  Sutures in wound bed.  4 cm x 2.5 cm x 0.1 cm some nonapproximated edges.  Left medial malleolus:  0.5 cm x 0.2 cm x  0.1 cm  Left hip, peeling epithelium:  0.5 cm x 0.3 cmx 0.1 cm  Wound bed: pale pink, steri strips in place to left lower leg.  Drainage (amount, consistency, odor) minimal serosanguinous  Periwound: intact Dressing procedure/placement/frequency: Cleanse wounds to left anterior lower leg, left malleolus and left hip with NS and pat dry. Apply mupirocin ointment and cover with dry dressing daily.  Silicone foam dressing to coccyx, will conform to wound depth. Change every three days and PRN soilage. Assess every day.  Will not follow at this time.  Please re-consult if needed.  Patricia HudsonKaren Amiera Herzberg MSN, RN, FNP-BC CWON Wound, Ostomy, Continence Nurse Pager [phone]

## 2018-07-10 NOTE — Progress Notes (Signed)
[facility]                                                                              Patient Demographics  Patricia Lynch, is a 84 y.o. female, DOB - 04-Apr-1929, WUJ:[id number]  Admit date - 07/08/2018   Admitting Physician Eduard Clos, MD  Outpatient Primary MD for the patient is Jarome Matin, MD  Outpatient specialists:   LOS - 1  days   Medical records reviewed and are as summarized below:    Chief Complaint  Patient presents with  . Fall  . Extremity Laceration       Brief summary   Patricia Riceis a 84 y.o.femalewithhistory of hypertension, hyperlipidemia was brought to the ER after patient had a fall. As per the patient's daughter patient had a fall about 2 days ago and has been lying on the floor since then. Patient tripped onto her main door and was caught in between. She lacerated her right leg, with bleeding from the wound. When patient's daughter went to check on her, pt was lying on the floor, conscious, weak and confused. In the ER patient was initially hypothermic which improved with bear hugger. UA showing features concerning for UTI. CK levels were around 3000 with mildly elevated troponin EKG showing normal sinus rhythm. CT head and C-spine were unremarkable, chest x-ray was unremarkable. Pt admitted for further management.   Assessment & Plan    Principal Problem:   Acute metabolic encephalopathy -Likely multifactorial including UTI, dehydration, failure to thrive, hypothermia, UTI -Chest x-ray showed atelectasis otherwise no pneumonia, CT head, cervical spine showed no acute abnormality.  MRI brain showed no acute stroke.  Active Problems:   Non-traumatic rhabdomyolysis -Please continue to fall, CK improving, continue IV fluid hydration, PT OT    Acute lower UTI -Urine culture showed no growth, complete ceftriaxone for 3 doses    Essential hypertension -BP currently stable    Troponin level elevated/ NSTEMI -Likely  demand ischemia, cardiology was consulted, recommended no further interventions and goals of care 2D echo showed EF normal, wall motion normal, grade 1 diastolic dysfunction    Hypothermia -Resolved    Dementia (HCC) failure to thrive -Palliative medicine consulted    Pressure injury of skin -Laceration of the right leg with stage III sacral decub -Wound care consulted  Code Status: DVT Prophylaxis:  Lovenox  Family Communication: Discussed in detail with the patient, all imaging results, lab results explained to the patient    Disposition Plan: Awaiting PT OT, goals of care  Time Spent in minutes 25 minutes  Procedures:  2D echo  Consultants:   Cardiology  Antimicrobials:      Medications  Scheduled Meds: . aspirin EC  81 mg Oral Daily  . enoxaparin (LOVENOX) injection  30 mg Subcutaneous Q24H  . losartan  50 mg Oral QHS  . mupirocin ointment   Topical Daily   Continuous Infusions: . sodium chloride 50 mL/hr at 07/10/18 0919  . cefTRIAXone (ROCEPHIN)  IV 1 g (07/09/18 2349)   PRN Meds:.acetaminophen **OR** acetaminophen, LORazepam, ondansetron **OR** ondansetron (ZOFRAN) IV   Antibiotics   Anti-infectives (From admission, onward)  Start     Dose/Rate Route Frequency Ordered Stop   07/08/18 2345  cefTRIAXone (ROCEPHIN) 1 g in sodium chloride 0.9 % 100 mL IVPB     1 g 200 mL/hr over 30 Minutes Intravenous Every 24 hours 07/08/18 2341          Subjective:   Patricia Lynch was seen and examined today.  Patient denies dizziness, chest pain, shortness of breath, abdominal pain, N/V.Marland Kitchen    Objective:   Vitals:   07/10/18 0029 07/10/18 0326 07/10/18 0743 07/10/18 1158  BP: (!) 122/55 (!) 128/58 (!) 131/57 (!) 126/55  Pulse: 68 72 72 80  Resp: 20 20 20 20   Temp: 98.4 F (36.9 C) 98.2 F (36.8 C) (!) 97.4 F (36.3 C) (!) 97.5 F (36.4 C)  TempSrc: Oral Oral Oral Axillary  SpO2: 96% 97% 97% 99%  Weight:      Height:        Intake/Output Summary (Last  24 hours) at 07/10/2018 1422 Last data filed at 07/10/2018 1145 Gross per 24 hour  Intake 1250 ml  Output 600 ml  Net 650 ml     Wt Readings from Last 3 Encounters:  07/08/18 52.4 kg     Exam  General: Alert and oriented x2, frail  Eyes:   HEENT:    Cardiovascular: S1 S2 auscultated, regular rate and rhythm.  Respiratory: Decreased breath sound at the bases  Gastrointestinal: Soft, nontender, nondistended, + bowel sounds  Ext: no pedal edema bilaterally  Neuro: No new deficits  Musculoskeletal: No digital cyanosis, clubbing  Skin: Stage III sacral decub, laceration of the right leg with dressing intact  Psych: Normal affect and demeanor, alert and oriented x2   Data Reviewed:  I have personally reviewed following labs and imaging studies  Micro Results Recent Results (from the past 240 hour(s))  Culture, blood (routine x 2)     Status: None (Preliminary result)   Collection Time: 07/08/18 11:56 PM  Result Value Ref Range Status   Specimen Description BLOOD RIGHT HAND  Final   Special Requests   Final    BOTTLES DRAWN AEROBIC ONLY Blood Culture adequate volume   Culture   Final    NO GROWTH 1 DAY Performed at [facility], [address], [place], [place] [address]    Report Status PENDING  Incomplete  Culture, blood (routine x 2)     Status: None (Preliminary result)   Collection Time: 07/08/18 11:56 PM  Result Value Ref Range Status   Specimen Description BLOOD RIGHT ANTECUBITAL  Final   Special Requests   Final    BOTTLES DRAWN AEROBIC ONLY Blood Culture results may not be optimal due to an excessive volume of blood received in culture bottles   Culture   Final    NO GROWTH 1 DAY Performed at [facility], [address], [place], [place] [address]    Report Status PENDING  Incomplete  Culture, Urine     Status: None   Collection Time: 07/09/18  1:46 AM  Result Value Ref Range Status   Specimen Description URINE, CLEAN CATCH  Final    Special Requests NONE  Final   Culture   Final    NO GROWTH Performed at [facility], [address], [place], [place] [address]    Report Status 07/10/2018 FINAL  Final    Radiology Reports Dg Tibia/fibula Left  Result Date: 07/08/2018 CLINICAL DATA:  The patient suffered a large laceration of  the right lower leg due to a fall today. Initial encounter. EXAM: LEFT TIBIA AND FIBULA - 2 VIEW COMPARISON:  None. FINDINGS: Large skin defect is seen along the medial aspect of the lower leg consistent with laceration. No underlying fracture or foreign body. IMPRESSION: Laceration without underlying fracture or foreign body. Electronically Signed   By: Drusilla Kannerhomas  Dalessio M.D.   On: 07/08/2018 16:24   Dg Abd 1 View  Result Date: 07/09/2018 CLINICAL DATA:  Initial evaluation for MRI clearance. EXAM: ABDOMEN - 1 VIEW COMPARISON:  None. FINDINGS: Bowel gas pattern within normal limits without obstruction or ileus. No abnormal bowel wall thickening. No soft tissue mass or abnormal calcification. No metallic implants or other foreign bodies. Lumbar levoscoliosis with moderate to advanced multilevel degenerative spondylolysis. Osteoarthritic changes noted about the hips bilaterally, right greater than left. IMPRESSION: 1. No metallic implants within the abdomen. Patient cleared for MRI. 2. Nonobstructive bowel gas pattern with no radiographic evidence for acute intra-abdominal process. Electronically Signed   By: Rise MuBenjamin  McClintock M.D.   On: 07/09/2018 05:23   Ct Head Wo Contrast  Result Date: 07/08/2018 CLINICAL DATA:  Fall.  Ataxia. EXAM: CT HEAD WITHOUT CONTRAST CT CERVICAL SPINE WITHOUT CONTRAST TECHNIQUE: Multidetector CT imaging of the head and cervical spine was performed following the standard protocol without intravenous contrast. Multiplanar CT image reconstructions of the cervical spine were also generated. COMPARISON:  May 26, 2018 FINDINGS: CT HEAD FINDINGS Brain: No subdural, epidural,  or subarachnoid hemorrhage. Ventricles and sulci are prominent but stable. Cerebellum, brainstem, and basal cisterns are normal. White matter changes identified. No acute cortical ischemia or infarct noted. No mass effect or midline shift. Vascular: No hyperdense vessel or unexpected calcification. Skull: Normal. Negative for fracture or focal lesion. Sinuses/Orbits: No acute finding. Other: None. CT CERVICAL SPINE FINDINGS Alignment: Normal. Skull base and vertebrae: No acute fracture. No primary bone lesion or focal pathologic process. Soft tissues and spinal canal: No prevertebral fluid or swelling. No visible canal hematoma. Disc levels:  Multilevel degenerative disc disease. Upper chest: Negative. Other: No other abnormalities. IMPRESSION: 1. No acute intracranial abnormalities. 2. No fracture or traumatic malalignment in the cervical spine. Degenerative changes. Electronically Signed   By: Gerome Samavid  Williams III M.D   On: 07/08/2018 18:07   Ct Cervical Spine Wo Contrast  Result Date: 07/08/2018 CLINICAL DATA:  Fall.  Ataxia. EXAM: CT HEAD WITHOUT CONTRAST CT CERVICAL SPINE WITHOUT CONTRAST TECHNIQUE: Multidetector CT imaging of the head and cervical spine was performed following the standard protocol without intravenous contrast. Multiplanar CT image reconstructions of the cervical spine were also generated. COMPARISON:  May 26, 2018 FINDINGS: CT HEAD FINDINGS Brain: No subdural, epidural, or subarachnoid hemorrhage. Ventricles and sulci are prominent but stable. Cerebellum, brainstem, and basal cisterns are normal. White matter changes identified. No acute cortical ischemia or infarct noted. No mass effect or midline shift. Vascular: No hyperdense vessel or unexpected calcification. Skull: Normal. Negative for fracture or focal lesion. Sinuses/Orbits: No acute finding. Other: None. CT CERVICAL SPINE FINDINGS Alignment: Normal. Skull base and vertebrae: No acute fracture. No primary bone lesion or focal  pathologic process. Soft tissues and spinal canal: No prevertebral fluid or swelling. No visible canal hematoma. Disc levels:  Multilevel degenerative disc disease. Upper chest: Negative. Other: No other abnormalities. IMPRESSION: 1. No acute intracranial abnormalities. 2. No fracture or traumatic malalignment in the cervical spine. Degenerative changes. Electronically Signed   By: Gerome Samavid  Williams III M.D   On: 07/08/2018 18:07   Mr  Brain Wo Contrast  Result Date: 07/09/2018 CLINICAL DATA:  84 year old female with increasing confusion. Fall yesterday with leg injury. EXAM: MRI HEAD WITHOUT CONTRAST TECHNIQUE: Multiplanar, multiecho pulse sequences of the brain and surrounding structures were obtained without intravenous contrast. COMPARISON:  Head and cervical spine CT 07/08/2018, 05/26/2018. FINDINGS: Brain: No restricted diffusion to suggest acute infarction. No midline shift, mass effect, evidence of mass lesion, ventriculomegaly, extra-axial collection or acute intracranial hemorrhage. Cervicomedullary junction and pituitary are within normal limits. Patchy and scattered cerebral white matter T2 and FLAIR hyperintensity, mild to moderate for age. No cortical encephalomalacia or chronic. Cerebral blood products identified the deep gray matter nuclei, brainstem, and cerebellum are normal for age. Vascular: Major intracranial vascular flow voids are preserved. Skull and upper cervical spine: Mild for age cervical spine degeneration. Normal bone marrow signal. Sinuses/Orbits: Postoperative changes to both globes, otherwise negative orbits. Paranasal sinuses and mastoids are stable and well pneumatized. Other: Visible internal auditory structures appear normal. Scalp and face soft tissues appear negative. IMPRESSION: 1.  No acute intracranial abnormality. 2. Mild to moderate for age signal changes in the cerebral white matter, most commonly due to chronic small vessel disease. Electronically Signed   By: Odessa Fleming  M.D.   On: 07/09/2018 06:38   Dg Chest Port 1 View  Result Date: 07/08/2018 CLINICAL DATA:  Patient status post fall today. EXAM: PORTABLE CHEST 1 VIEW COMPARISON:  None. FINDINGS: There is a small left pleural effusion and mild left basilar atelectasis. The right lung is clear. No right effusion. No pneumothorax on the right or left. Heart size is upper normal. Aortic atherosclerosis noted. No acute or focal bony abnormality. IMPRESSION: Small left pleural effusion and mild basilar airspace disease which is likely atelectasis rather than pneumonia. Atherosclerosis. Electronically Signed   By: Drusilla Kanner M.D.   On: 07/08/2018 16:23   Vas [place] Lower Extremity Venous (dvt)  Result Date: 07/09/2018  Lower Venous Study Indications: Edema.  Performing Technologist: Blanch Media RVS  Examination Guidelines: A complete evaluation includes B-mode imaging, spectral Doppler, color Doppler, and power Doppler as needed of all accessible portions of each vessel. Bilateral testing is considered an integral part of a complete examination. Limited examinations for reoccurring indications may be performed as noted.  Right Venous Findings: +---------+---------------+---------+-----------+----------+--------------+          CompressibilityPhasicitySpontaneityPropertiesSummary        +---------+---------------+---------+-----------+----------+--------------+ CFV      Full           Yes      Yes                                 +---------+---------------+---------+-----------+----------+--------------+ SFJ      Full                                                        +---------+---------------+---------+-----------+----------+--------------+ FV Prox  Full                                                        +---------+---------------+---------+-----------+----------+--------------+ FV Mid   Full                                                         +---------+---------------+---------+-----------+----------+--------------+  FV DistalFull                                                        +---------+---------------+---------+-----------+----------+--------------+ PFV      Full                                                        +---------+---------------+---------+-----------+----------+--------------+ POP      Full           Yes      Yes                                 +---------+---------------+---------+-----------+----------+--------------+ PTV      Full                                                        +---------+---------------+---------+-----------+----------+--------------+ PERO                                                  not visualized +---------+---------------+---------+-----------+----------+--------------+  Left Venous Findings: +---------+---------------+---------+-----------+----------+--------------+          CompressibilityPhasicitySpontaneityPropertiesSummary        +---------+---------------+---------+-----------+----------+--------------+ CFV      Full           Yes      Yes                                 +---------+---------------+---------+-----------+----------+--------------+ SFJ      Full                                                        +---------+---------------+---------+-----------+----------+--------------+ FV Prox  Full                                                        +---------+---------------+---------+-----------+----------+--------------+ FV Mid   Full                                                        +---------+---------------+---------+-----------+----------+--------------+ FV DistalFull                                                        +---------+---------------+---------+-----------+----------+--------------+  PFV      Full                                                         +---------+---------------+---------+-----------+----------+--------------+ POP      Full           Yes      Yes                                 +---------+---------------+---------+-----------+----------+--------------+ PTV      Full                                                        +---------+---------------+---------+-----------+----------+--------------+ PERO                                                  not visualized +---------+---------------+---------+-----------+----------+--------------+    Summary: Right: There is no evidence of deep vein thrombosis in the lower extremity. No cystic structure found in the popliteal fossa. Left: There is no evidence of deep vein thrombosis in the lower extremity. No cystic structure found in the popliteal fossa.  *See table(s) above for measurements and observations. Electronically signed by Fabienne Bruns MD on 07/09/2018 at 5:38:43 PM.    Final     Lab Data:  CBC: Recent Labs  Lab 07/08/18 1627 07/09/18 0704 07/10/18 0342  WBC 12.8* 10.9* 8.1  NEUTROABS 11.5*  --  5.8  HGB 10.9* 9.7* 9.5*  HCT 34.2* 31.2* 30.2*  MCV 85.3 84.1 84.4  PLT 309 322 327   Basic Metabolic Panel: Recent Labs  Lab 07/08/18 1627 07/08/18 2356 07/09/18 0704 07/10/18 0342  NA 138  --  138 138  K 3.6  --  3.7 3.1*  CL 104  --  103 106  CO2 24  --  21* 23  GLUCOSE 149*  --  146* 106*  BUN 22  --  20 13  CREATININE 1.00  --  1.23* 1.07*  CALCIUM 8.2*  --  7.9* 7.7*  MG  --  1.7  --   --    GFR: Estimated Creatinine Clearance: 30.6 mL/min (A) (by C-G formula based on SCr of 1.07 mg/dL (H)). Liver Function Tests: Recent Labs  Lab 07/08/18 1627  AST 112*  ALT 51*  ALKPHOS 153*  BILITOT 1.0  PROT 5.3*  ALBUMIN 2.5*   No results for input(s): LIPASE, AMYLASE in the last 168 hours. No results for input(s): AMMONIA in the last 168 hours. Coagulation Profile: No results for input(s): INR, PROTIME in the last 168 hours. Cardiac  Enzymes: Recent Labs  Lab 07/08/18 1627 07/08/18 2356 07/09/18 0704 07/09/18 1156 07/10/18 0342  CKTOTAL 3,163*  --  2,602*  --  949*  TROPONINI 0.79* 1.39* 1.14* 0.96*  --    BNP (last 3 results) No results for input(s): PROBNP in the last 8760 hours. HbA1C: No results for input(s): HGBA1C in the last 72 hours. CBG: No results for input(s):  GLUCAP in the last 168 hours. Lipid Profile: No results for input(s): CHOL, HDL, LDLCALC, TRIG, CHOLHDL, LDLDIRECT in the last 72 hours. Thyroid Function Tests: Recent Labs    07/08/18 2356  TSH 2.939   Anemia Panel: No results for input(s): VITAMINB12, FOLATE, FERRITIN, TIBC, IRON, RETICCTPCT in the last 72 hours. Urine analysis:    Component Value Date/Time   COLORURINE YELLOW 07/08/2018 1922   APPEARANCEUR HAZY (A) 07/08/2018 1922   LABSPEC 1.019 07/08/2018 1922   PHURINE 5.0 07/08/2018 1922   GLUCOSEU 150 (A) 07/08/2018 1922   HGBUR MODERATE (A) 07/08/2018 1922   BILIRUBINUR NEGATIVE 07/08/2018 1922   KETONESUR 20 (A) 07/08/2018 1922   PROTEINUR NEGATIVE 07/08/2018 1922   NITRITE NEGATIVE 07/08/2018 1922   LEUKOCYTESUR LARGE (A) 07/08/2018 1922     Nadean Montanaro M.D. [facility] 07/10/2018, 2:22 PM  Pager: [phone] Between 7am to 7pm - call Pager - [phone]  After 7pm go to [email or web address] - password [email or web address]  Call night coverage person covering after 7pm

## 2018-07-10 NOTE — Evaluation (Signed)
Physical Therapy Evaluation Patient Details Name: Patricia Lynch MRN: [id number] DOB: 1928/10/08 Today's Date: 07/10/2018   History of Present Illness  84 y/o female with onset of acute encephalopathy and FTT with ate;ectasis, UTI and elevated troponin was admitted after a fall at home.  Has significant LE pain from the trauma, no fractures.  PMHx:  HLD, HTN, dementia, falls,   Clinical Impression  Pt was seen for mobility on the side of bed and was unable to take a step.  Pt will continue on with therapy as tolerated until dc to next venue is complete.  Follow for strength, endurance and balance skills.    Follow Up Recommendations SNF    Equipment Recommendations  Rolling walker with 5" wheels    Recommendations for Other Services       Precautions / Restrictions Precautions Precautions: Fall Restrictions Weight Bearing Restrictions: No      Mobility  Bed Mobility Overal bed mobility: Needs Assistance Bed Mobility: Supine to Sit;Sit to Supine     Supine to sit: Mod assist Sit to supine: Total assist   General bed mobility comments: total assist to scoot up in bed  Transfers Overall transfer level: Needs assistance Equipment used: Rolling walker (2 wheeled) Transfers: Sit to/from Stand Sit to Stand: Max assist;From elevated surface         General transfer comment: attempted with walker but with PT directly standing her was more upright  Ambulation/Gait             General Gait Details: unable  Stairs            Wheelchair Mobility    Modified Rankin (Stroke Patients Only)       Balance Overall balance assessment: History of Falls                                           Pertinent Vitals/Pain Pain Assessment: 0-10 Pain Score: 7  Pain Location: B legs Pain Descriptors / Indicators: Aching;Sharp Pain Intervention(s): Limited activity within patient's tolerance;Monitored during session;Premedicated before  session;Repositioned    Home Living Family/patient expects to be discharged to:: Private residence Living Arrangements: Alone Available Help at Discharge: Family;Available 24 hours/day;Available PRN/intermittently Type of Home: House Home Access: Stairs to enter Entrance Stairs-Rails: Can reach both Entrance Stairs-Number of Steps: 4-5 Home Layout: One level Home Equipment: Walker - 2 wheels;Cane - single point Additional Comments: pt has been attempting to stay alone    Prior Function Level of Independence: Independent with assistive device(s)         Comments: daughter cooks for her     Hand Dominance   Dominant Hand: Right    Extremity/Trunk Assessment   Upper Extremity Assessment Upper Extremity Assessment: Generalized weakness    Lower Extremity Assessment Lower Extremity Assessment: Generalized weakness    Cervical / Trunk Assessment Cervical / Trunk Assessment: Kyphotic  Communication   Communication: No difficulties  Cognition Arousal/Alertness: Awake/alert Behavior During Therapy: Anxious Overall Cognitive Status: Difficult to assess                                 General Comments: anxious to attempt movement due to chronic back pain exacerbated by fall      General Comments      Exercises     Assessment/Plan    PT Assessment  Patient needs continued PT services  PT Problem List Decreased strength;Decreased range of motion;Decreased activity tolerance;Decreased balance;Decreased mobility;Decreased coordination;Decreased knowledge of use of DME;Decreased safety awareness;Pain;Decreased skin integrity       PT Treatment Interventions DME instruction;Gait training;Stair training;Functional mobility training;Therapeutic activities;Therapeutic exercise;Balance training;Neuromuscular re-education;Patient/family education    PT Goals (Current goals can be found in the Care Plan section)  Acute Rehab PT Goals Patient Stated Goal: (none  stated) PT Goal Formulation: With patient/family Time For Goal Achievement: 07/24/18 Potential to Achieve Goals: Good    Frequency Min 2X/week   Barriers to discharge Decreased caregiver support;Inaccessible home environment home alone with stairs to enter house    Co-evaluation               AM-PAC PT "6 Clicks" Mobility  Outcome Measure Help needed turning from your back to your side while in a flat bed without using bedrails?: A Little Help needed moving from lying on your back to sitting on the side of a flat bed without using bedrails?: A Lot Help needed moving to and from a bed to a chair (including a wheelchair)?: A Lot Help needed standing up from a chair using your arms (e.g., wheelchair or bedside chair)?: Total Help needed to walk in hospital room?: Total Help needed climbing 3-5 steps with a railing? : Total 6 Click Score: 10    End of Session Equipment Utilized During Treatment: Gait belt Activity Tolerance: Patient tolerated treatment well;Patient limited by pain Patient left: in bed;with call bell/phone within reach;with bed alarm set;with family/visitor present Nurse Communication: Mobility status PT Visit Diagnosis: Unsteadiness on feet (R26.81);Muscle weakness (generalized) (M62.81);History of falling (Z91.81);Pain Pain - Right/Left: (Both) Pain - part of body: Leg    Time: 1610-9604 PT Time Calculation (min) (ACUTE ONLY): 30 min   Charges:   PT Evaluation $PT Eval Moderate Complexity: 1 Mod PT Treatments $Therapeutic Activity: 8-22 mins       Ivar Drape 07/10/2018, 9:11 PM   Samul Dada, PT MS Acute Rehab Dept. Number: [facility] [id number] and [facility] [phone]

## 2018-07-10 NOTE — Progress Notes (Signed)
Progress Note  Patient Name: Patricia Lynch Date of Encounter: 07/10/2018  Primary Cardiologist: Chrystie Nose, MD new  Subjective   No pain-chest  Inpatient Medications    Scheduled Meds: . aspirin EC  81 mg Oral Daily  . enoxaparin (LOVENOX) injection  30 mg Subcutaneous Q24H  . losartan  50 mg Oral QHS  . mupirocin ointment   Topical Daily   Continuous Infusions: . sodium chloride 50 mL/hr at 07/10/18 0919  . cefTRIAXone (ROCEPHIN)  IV 1 g (07/09/18 2349)   PRN Meds: acetaminophen **OR** acetaminophen, LORazepam, ondansetron **OR** ondansetron (ZOFRAN) IV   Vital Signs    Vitals:   07/09/18 1940 07/10/18 0029 07/10/18 0326 07/10/18 0743  BP: 101/70 (!) 122/55 (!) 128/58 (!) 131/57  Pulse: 83 68 72 72  Resp: 18 20 20 20   Temp: 98.2 F (36.8 C) 98.4 F (36.9 C) 98.2 F (36.8 C) (!) 97.4 F (36.3 C)  TempSrc: Oral Oral Oral Oral  SpO2: 95% 96% 97% 97%  Weight:      Height:        Intake/Output Summary (Last 24 hours) at 07/10/2018 1022 Last data filed at 07/10/2018 0400 Gross per 24 hour  Intake 1250 ml  Output 550 ml  Net 700 ml   Filed Weights   07/08/18 2032  Weight: 52.4 kg    Telemetry    SR - Personally Reviewed  ECG    No new - Personally Reviewed  Physical Exam  Per Dr. Rennis Golden GEN: No acute distress.   Neck: No JVD Cardiac: RRR, no murmurs, rubs, or gallops.  Respiratory: Clear to auscultation bilaterally. GI: Soft, nontender, non-distended  MS: No edema; No deformity. Neuro:  Nonfocal  Psych: Normal affect   Labs    Chemistry Recent Labs  Lab 07/08/18 1627 07/09/18 0704 07/10/18 0342  NA 138 138 138  K 3.6 3.7 3.1*  CL 104 103 106  CO2 24 21* 23  GLUCOSE 149* 146* 106*  BUN 22 20 13   CREATININE 1.00 1.23* 1.07*  CALCIUM 8.2* 7.9* 7.7*  PROT 5.3*  --   --   ALBUMIN 2.5*  --   --   AST 112*  --   --   ALT 51*  --   --   ALKPHOS 153*  --   --   BILITOT 1.0  --   --   GFRNONAA 51* 39* 47*  GFRAA 59* 46* 54*  ANIONGAP  10 14 9      Hematology Recent Labs  Lab 07/08/18 1627 07/09/18 0704 07/10/18 0342  WBC 12.8* 10.9* 8.1  RBC 4.01 3.71* 3.58*  HGB 10.9* 9.7* 9.5*  HCT 34.2* 31.2* 30.2*  MCV 85.3 84.1 84.4  MCH 27.2 26.1 26.5  MCHC 31.9 31.1 31.5  RDW 14.0 14.1 14.5  PLT 309 322 327    Cardiac Enzymes Recent Labs  Lab 07/08/18 1627 07/08/18 2356 07/09/18 0704 07/09/18 1156  TROPONINI 0.79* 1.39* 1.14* 0.96*    Recent Labs  Lab 07/08/18 1949  TROPIPOC 0.98*     BNPNo results for input(s): BNP, PROBNP in the last 168 hours.   DDimer No results for input(s): DDIMER in the last 168 hours.   Radiology    Dg Tibia/fibula Left  Result Date: 07/08/2018 CLINICAL DATA:  The patient suffered a large laceration of the right lower leg due to a fall today. Initial encounter. EXAM: LEFT TIBIA AND FIBULA - 2 VIEW COMPARISON:  None. FINDINGS: Large skin defect is seen along the  medial aspect of the lower leg consistent with laceration. No underlying fracture or foreign body. IMPRESSION: Laceration without underlying fracture or foreign body. Electronically Signed   By: Drusilla Kanner M.D.   On: 07/08/2018 16:24   Dg Abd 1 View  Result Date: 07/09/2018 CLINICAL DATA:  Initial evaluation for MRI clearance. EXAM: ABDOMEN - 1 VIEW COMPARISON:  None. FINDINGS: Bowel gas pattern within normal limits without obstruction or ileus. No abnormal bowel wall thickening. No soft tissue mass or abnormal calcification. No metallic implants or other foreign bodies. Lumbar levoscoliosis with moderate to advanced multilevel degenerative spondylolysis. Osteoarthritic changes noted about the hips bilaterally, right greater than left. IMPRESSION: 1. No metallic implants within the abdomen. Patient cleared for MRI. 2. Nonobstructive bowel gas pattern with no radiographic evidence for acute intra-abdominal process. Electronically Signed   By: Rise Mu M.D.   On: 07/09/2018 05:23   Ct Head Wo Contrast  Result  Date: 07/08/2018 CLINICAL DATA:  Fall.  Ataxia. EXAM: CT HEAD WITHOUT CONTRAST CT CERVICAL SPINE WITHOUT CONTRAST TECHNIQUE: Multidetector CT imaging of the head and cervical spine was performed following the standard protocol without intravenous contrast. Multiplanar CT image reconstructions of the cervical spine were also generated. COMPARISON:  May 26, 2018 FINDINGS: CT HEAD FINDINGS Brain: No subdural, epidural, or subarachnoid hemorrhage. Ventricles and sulci are prominent but stable. Cerebellum, brainstem, and basal cisterns are normal. White matter changes identified. No acute cortical ischemia or infarct noted. No mass effect or midline shift. Vascular: No hyperdense vessel or unexpected calcification. Skull: Normal. Negative for fracture or focal lesion. Sinuses/Orbits: No acute finding. Other: None. CT CERVICAL SPINE FINDINGS Alignment: Normal. Skull base and vertebrae: No acute fracture. No primary bone lesion or focal pathologic process. Soft tissues and spinal canal: No prevertebral fluid or swelling. No visible canal hematoma. Disc levels:  Multilevel degenerative disc disease. Upper chest: Negative. Other: No other abnormalities. IMPRESSION: 1. No acute intracranial abnormalities. 2. No fracture or traumatic malalignment in the cervical spine. Degenerative changes. Electronically Signed   By: Gerome Sam III M.D   On: 07/08/2018 18:07   Ct Cervical Spine Wo Contrast  Result Date: 07/08/2018 CLINICAL DATA:  Fall.  Ataxia. EXAM: CT HEAD WITHOUT CONTRAST CT CERVICAL SPINE WITHOUT CONTRAST TECHNIQUE: Multidetector CT imaging of the head and cervical spine was performed following the standard protocol without intravenous contrast. Multiplanar CT image reconstructions of the cervical spine were also generated. COMPARISON:  May 26, 2018 FINDINGS: CT HEAD FINDINGS Brain: No subdural, epidural, or subarachnoid hemorrhage. Ventricles and sulci are prominent but stable. Cerebellum, brainstem, and  basal cisterns are normal. White matter changes identified. No acute cortical ischemia or infarct noted. No mass effect or midline shift. Vascular: No hyperdense vessel or unexpected calcification. Skull: Normal. Negative for fracture or focal lesion. Sinuses/Orbits: No acute finding. Other: None. CT CERVICAL SPINE FINDINGS Alignment: Normal. Skull base and vertebrae: No acute fracture. No primary bone lesion or focal pathologic process. Soft tissues and spinal canal: No prevertebral fluid or swelling. No visible canal hematoma. Disc levels:  Multilevel degenerative disc disease. Upper chest: Negative. Other: No other abnormalities. IMPRESSION: 1. No acute intracranial abnormalities. 2. No fracture or traumatic malalignment in the cervical spine. Degenerative changes. Electronically Signed   By: Gerome Sam III M.D   On: 07/08/2018 18:07   Mr Brain Wo Contrast  Result Date: 07/09/2018 CLINICAL DATA:  84 year old female with increasing confusion. Fall yesterday with leg injury. EXAM: MRI HEAD WITHOUT CONTRAST TECHNIQUE: Multiplanar, multiecho pulse sequences  of the brain and surrounding structures were obtained without intravenous contrast. COMPARISON:  Head and cervical spine CT 07/08/2018, 05/26/2018. FINDINGS: Brain: No restricted diffusion to suggest acute infarction. No midline shift, mass effect, evidence of mass lesion, ventriculomegaly, extra-axial collection or acute intracranial hemorrhage. Cervicomedullary junction and pituitary are within normal limits. Patchy and scattered cerebral white matter T2 and FLAIR hyperintensity, mild to moderate for age. No cortical encephalomalacia or chronic. Cerebral blood products identified the deep gray matter nuclei, brainstem, and cerebellum are normal for age. Vascular: Major intracranial vascular flow voids are preserved. Skull and upper cervical spine: Mild for age cervical spine degeneration. Normal bone marrow signal. Sinuses/Orbits: Postoperative changes  to both globes, otherwise negative orbits. Paranasal sinuses and mastoids are stable and well pneumatized. Other: Visible internal auditory structures appear normal. Scalp and face soft tissues appear negative. IMPRESSION: 1.  No acute intracranial abnormality. 2. Mild to moderate for age signal changes in the cerebral white matter, most commonly due to chronic small vessel disease. Electronically Signed   By: Odessa Fleming M.D.   On: 07/09/2018 06:38   Dg Chest Port 1 View  Result Date: 07/08/2018 CLINICAL DATA:  Patient status post fall today. EXAM: PORTABLE CHEST 1 VIEW COMPARISON:  None. FINDINGS: There is a small left pleural effusion and mild left basilar atelectasis. The right lung is clear. No right effusion. No pneumothorax on the right or left. Heart size is upper normal. Aortic atherosclerosis noted. No acute or focal bony abnormality. IMPRESSION: Small left pleural effusion and mild basilar airspace disease which is likely atelectasis rather than pneumonia. Atherosclerosis. Electronically Signed   By: Drusilla Kanner M.D.   On: 07/08/2018 16:23   Vas [place] Lower Extremity Venous (dvt)  Result Date: 07/09/2018  Lower Venous Study Indications: Edema.  Performing Technologist: Blanch Media RVS  Examination Guidelines: A complete evaluation includes B-mode imaging, spectral Doppler, color Doppler, and power Doppler as needed of all accessible portions of each vessel. Bilateral testing is considered an integral part of a complete examination. Limited examinations for reoccurring indications may be performed as noted.  Right Venous Findings: +---------+---------------+---------+-----------+----------+--------------+          CompressibilityPhasicitySpontaneityPropertiesSummary        +---------+---------------+---------+-----------+----------+--------------+ CFV      Full           Yes      Yes                                  +---------+---------------+---------+-----------+----------+--------------+ SFJ      Full                                                        +---------+---------------+---------+-----------+----------+--------------+ FV Prox  Full                                                        +---------+---------------+---------+-----------+----------+--------------+ FV Mid   Full                                                        +---------+---------------+---------+-----------+----------+--------------+  FV DistalFull                                                        +---------+---------------+---------+-----------+----------+--------------+ PFV      Full                                                        +---------+---------------+---------+-----------+----------+--------------+ POP      Full           Yes      Yes                                 +---------+---------------+---------+-----------+----------+--------------+ PTV      Full                                                        +---------+---------------+---------+-----------+----------+--------------+ PERO                                                  not visualized +---------+---------------+---------+-----------+----------+--------------+  Left Venous Findings: +---------+---------------+---------+-----------+----------+--------------+          CompressibilityPhasicitySpontaneityPropertiesSummary        +---------+---------------+---------+-----------+----------+--------------+ CFV      Full           Yes      Yes                                 +---------+---------------+---------+-----------+----------+--------------+ SFJ      Full                                                        +---------+---------------+---------+-----------+----------+--------------+ FV Prox  Full                                                         +---------+---------------+---------+-----------+----------+--------------+ FV Mid   Full                                                        +---------+---------------+---------+-----------+----------+--------------+ FV DistalFull                                                        +---------+---------------+---------+-----------+----------+--------------+  PFV      Full                                                        +---------+---------------+---------+-----------+----------+--------------+ POP      Full           Yes      Yes                                 +---------+---------------+---------+-----------+----------+--------------+ PTV      Full                                                        +---------+---------------+---------+-----------+----------+--------------+ PERO                                                  not visualized +---------+---------------+---------+-----------+----------+--------------+    Summary: Right: There is no evidence of deep vein thrombosis in the lower extremity. No cystic structure found in the popliteal fossa. Left: There is no evidence of deep vein thrombosis in the lower extremity. No cystic structure found in the popliteal fossa.  *See table(s) above for measurements and observations. Electronically signed by Fabienne Bruns MD on 07/09/2018 at 5:38:43 PM.    Final     Cardiac Studies   Echo 07/09/18 Study Conclusions  - Left ventricle: The cavity size was normal. Systolic function was   normal. Wall motion was normal; there were no regional wall   motion abnormalities. Doppler parameters are consistent with   abnormal left ventricular relaxation (grade 1 diastolic   dysfunction). - Aortic valve: There was trivial regurgitation. - Mitral valve: There was mild to moderate regurgitation. - Pulmonary arteries: Systolic pressure was mildly increased. PA   peak pressure: 43 mm Hg (S).  Venous  doppler Summary: Right: There is no evidence of deep vein thrombosis in the lower extremity. No cystic structure found in the popliteal fossa. Left: There is no evidence of deep vein thrombosis in the lower extremity. No cystic structure found in the popliteal fossa.  Patient Profile     84 y.o. female with a hx of HTN, HLD, dementia admitted with mechanical fall with Rhabdo -CK was 3163 and troponin 1.39.  No MB. No chest pain.  Assessment & Plan    Elevated troponin, most likely demand ischemia from fall and in floor for several hours.  No chest pain and echo with no RWMA, EF is normal.  No chest pain.       For questions or updates, please contact [facility] Please consult [email or web address] for contact info under        Signed, Nada Boozer, NP  07/10/2018, 10:22 AM

## 2018-07-11 DIAGNOSIS — F0151 Vascular dementia with behavioral disturbance: Secondary | ICD-10-CM

## 2018-07-11 LAB — CBC WITH DIFFERENTIAL/PLATELET
Abs Immature Granulocytes: 0.02 10*3/uL (ref 0.00–0.07)
Basophils Absolute: 0 10*3/uL (ref 0.0–0.1)
Basophils Relative: 0 %
Eosinophils Absolute: 0.2 10*3/uL (ref 0.0–0.5)
Eosinophils Relative: 3 %
HCT: 28.8 % — ABNORMAL LOW (ref 36.0–46.0)
Hemoglobin: 8.9 g/dL — ABNORMAL LOW (ref 12.0–15.0)
Immature Granulocytes: 0 %
Lymphocytes Relative: 18 %
Lymphs Abs: 1.2 10*3/uL (ref 0.7–4.0)
MCH: 26 pg (ref 26.0–34.0)
MCHC: 30.9 g/dL (ref 30.0–36.0)
MCV: 84.2 fL (ref 80.0–100.0)
MONOS PCT: 9 %
Monocytes Absolute: 0.6 10*3/uL (ref 0.1–1.0)
NEUTROS PCT: 70 %
Neutro Abs: 4.8 10*3/uL (ref 1.7–7.7)
Platelets: 305 10*3/uL (ref 150–400)
RBC: 3.42 MIL/uL — ABNORMAL LOW (ref 3.87–5.11)
RDW: 14.6 % (ref 11.5–15.5)
WBC: 6.8 10*3/uL (ref 4.0–10.5)
nRBC: 0 % (ref 0.0–0.2)

## 2018-07-11 LAB — BASIC METABOLIC PANEL
Anion gap: 9 (ref 5–15)
BUN: 10 mg/dL (ref 8–23)
CALCIUM: 7.7 mg/dL — AB (ref 8.9–10.3)
CO2: 24 mmol/L (ref 22–32)
Chloride: 106 mmol/L (ref 98–111)
Creatinine, Ser: 0.89 mg/dL (ref 0.44–1.00)
GFR calc Af Amer: >60 mL/min (ref >60)
GFR calc non Af Amer: 58 mL/min — ABNORMAL LOW (ref >60)
GLUCOSE: 143 mg/dL — AB (ref 70–99)
Potassium: 3.5 mmol/L (ref 3.5–5.1)
Sodium: 139 mmol/L (ref 135–145)

## 2018-07-11 LAB — GLUCOSE, CAPILLARY
GLUCOSE-CAPILLARY: 165 mg/dL — AB (ref 70–99)
Glucose-Capillary: 157 mg/dL — ABNORMAL HIGH (ref 70–99)
Glucose-Capillary: 229 mg/dL — ABNORMAL HIGH (ref 70–99)

## 2018-07-11 LAB — CK: CK TOTAL: 448 U/L — AB (ref 38–234)

## 2018-07-11 MED ORDER — ENOXAPARIN SODIUM 40 MG/0.4ML ~~LOC~~ SOLN
40.0000 mg | SUBCUTANEOUS | Status: DC
  Administered 2018-07-12: 40 mg via SUBCUTANEOUS
  Filled 2018-07-11 (×3): qty 0.4

## 2018-07-11 NOTE — Progress Notes (Signed)
ANTICOAGULATION CONSULT NOTE - Initial Consult  Pharmacy Consult for lovenox Indication: VTE prophylaxis  Allergies  Allergen Reactions  . Beef-Derived Products Nausea Only  . Other Nausea Only    Some vegetables    Patient Measurements: Height: 5\' 3"  (160 cm) Weight: 115 lb 8.3 oz (52.4 kg) IBW/kg (Calculated) : 52.4 Heparin Dosing Weight: 52.4 kg  Vital Signs: Temp: 98.5 F (36.9 C) (12/04 0713) Temp Source: Axillary (12/04 0713) BP: 129/58 (12/04 0713) Pulse Rate: 70 (12/04 0713)  Labs: Recent Labs    07/08/18 2356 07/09/18 0704 07/09/18 1156 07/10/18 0342 07/11/18 0440  HGB  --  9.7*  --  9.5* 8.9*  HCT  --  31.2*  --  30.2* 28.8*  PLT  --  322  --  327 305  CREATININE  --  1.23*  --  1.07* 0.89  CKTOTAL  --  2,602*  --  949* 448*  TROPONINI 1.39* 1.14* 0.96*  --   --     Estimated Creatinine Clearance: 36.8 mL/min (by C-G formula based on SCr of 0.89 mg/dL).   Medical History: Past Medical History:  Diagnosis Date  . Dementia (HCC)   . Hyperlipidemia   . Hypertension     Medications:  See medication history  Assessment: 84 yo lady to start lovenox for VTE px. Scr has improved to 0.89 with CrCl ~36. TBW 52 kg. Will increase to normal dose.   Goal of Therapy:  Prevention of VTE Monitor platelets by anticoagulation protocol: Yes   Plan:  Lovenox 40 mg sq q24 hours Monitor for bleeding complications  Swayze Pries A. Jeanella CrazePierce, PharmD, BCPS Clinical Pharmacist [place] Pager: [phone] Please utilize Amion for appropriate phone number to reach the unit pharmacist [facility]   07/11/2018,7:52 AM

## 2018-07-11 NOTE — Progress Notes (Signed)
[facility]                                                                              Patient Demographics  Patricia Lynch, is a 84 y.o. female, DOB - 02/24/1929, ZOX:[id number]  Admit date - 07/08/2018   Admitting Physician Eduard Clos, MD  Outpatient Primary MD for the patient is Jarome Matin, MD  Outpatient specialists:   LOS - 2  days   Medical records reviewed and are as summarized below:    Chief Complaint  Patient presents with  . Fall  . Extremity Laceration       Brief summary   Patricia Riceis a 84 y.o.femalewithhistory of hypertension, hyperlipidemia was brought to the ER after patient had a fall. As per the patient's daughter patient had a fall about 2 days ago and has been lying on the floor since then. Patient tripped onto her main door and was caught in between. She lacerated her right leg, with bleeding from the wound. When patient's daughter went to check on her, pt was lying on the floor, conscious, weak and confused. In the ER patient was initially hypothermic which improved with bear hugger. UA showing features concerning for UTI. CK levels were around 3000 with mildly elevated troponin EKG showing normal sinus rhythm. CT head and C-spine were unremarkable, chest x-ray was unremarkable. Pt admitted for further management.   Assessment & Plan    Principal Problem:   Acute metabolic encephalopathy likely has underlying dementia -Likely multifactorial including UTI, dehydration, failure to thrive, hypothermia, UTI -Chest x-ray showed atelectasis otherwise no pneumonia, CT head, cervical spine showed no acute abnormality.  MRI brain showed no acute stroke , has mild to moderate chronic small vessel disease. -Still somewhat confused, oriented to self  Active Problems:   Non-traumatic rhabdomyolysis Likely due to fall, CKs are improving, 448 today, continue gentle hydration  PT OT evaluation recommended skilled nursing  facility, social work consulted     Acute lower UTI -Urine culture showed no growth, complete ceftriaxone for 3 doses    Essential hypertension -BP currently stable    Troponin level elevated/ NSTEMI -Likely demand ischemia, cardiology was consulted, recommended no further interventions and goals of care 2D echo showed EF normal, wall motion normal, grade 1 diastolic dysfunction    Hypothermia -Resolved    Dementia (HCC) failure to thrive -Palliative medicine consulted, DNR, appreciate recommendations, outpatient palliative follow-up at the skilled nursing facility    Pressure injury of skin -Laceration of the right leg with stage III sacral decub -Wound care consulted  Code Status: DVT Prophylaxis:  Lovenox  Family Communication: Discussed in detail with the patient, all imaging results, lab results explained to the patient    Disposition Plan: When bed available, at the skilled nursing facility  Time Spent in minutes 25 minutes  Procedures:  2D echo  Consultants:   Cardiology  Antimicrobials:      Medications  Scheduled Meds: . aspirin EC  81 mg Oral Daily  . [START ON 07/12/2018] enoxaparin (LOVENOX) injection  40 mg Subcutaneous Q24H  . losartan  50 mg Oral QHS  . mupirocin ointment  Topical Daily   Continuous Infusions: . cefTRIAXone (ROCEPHIN)  IV Stopped (07/11/18 0837)   PRN Meds:.acetaminophen **OR** acetaminophen, LORazepam, morphine injection, ondansetron **OR** ondansetron (ZOFRAN) IV   Antibiotics   Anti-infectives (From admission, onward)   Start     Dose/Rate Route Frequency Ordered Stop   07/08/18 2345  cefTRIAXone (ROCEPHIN) 1 g in sodium chloride 0.9 % 100 mL IVPB     1 g 200 mL/hr over 30 Minutes Intravenous Every 24 hours 07/08/18 2341          Subjective:   Patricia Lynch was seen and examined today.  Declines any complaints.  No acute issues.  Denies any dizziness, chest pain, shortness of breath, abdominal pain, nausea  vomiting. Objective:   Vitals:   07/11/18 0000 07/11/18 0402 07/11/18 0713 07/11/18 1210  BP: 125/78 131/61 (!) 129/58 136/71  Pulse: 78 76 70 75  Resp: 16 17 15 16   Temp: 98.7 F (37.1 C)  98.5 F (36.9 C) 98 F (36.7 C)  TempSrc: Oral  Axillary Oral  SpO2: 92% 93% 94% 94%  Weight:      Height:        Intake/Output Summary (Last 24 hours) at 07/11/2018 1334 Last data filed at 07/11/2018 0000 Gross per 24 hour  Intake 120 ml  Output -  Net 120 ml     Wt Readings from Last 3 Encounters:  07/08/18 52.4 kg   Physical Exam  General: Alert and oriented x self, NAD  Eyes:   HEENT:    Cardiovascular: S1 S2 auscultated, Regular rate and rhythm. No pedal edema b/l  Respiratory: Clear to auscultation bilaterally, no wheezing, rales or rhonchi  Gastrointestinal: Soft, nontender, nondistended, + bowel sounds  Ext: no pedal edema bilaterally  Neuro: no new deficits  Musculoskeletal: No digital cyanosis, clubbing  Skin: Stage II sacral decub with laceration of the right leg with dressing intact  Psych: somewhat confused, dementia     Data Reviewed:  I have personally reviewed following labs and imaging studies  Micro Results Recent Results (from the past 240 hour(s))  Culture, blood (routine x 2)     Status: None (Preliminary result)   Collection Time: 07/08/18 11:56 PM  Result Value Ref Range Status   Specimen Description BLOOD RIGHT HAND  Final   Special Requests   Final    BOTTLES DRAWN AEROBIC ONLY Blood Culture adequate volume   Culture   Final    NO GROWTH 2 DAYS Performed at [facility], [address] [place], [place] [address]    Report Status PENDING  Incomplete  Culture, blood (routine x 2)     Status: None (Preliminary result)   Collection Time: 07/08/18 11:56 PM  Result Value Ref Range Status   Specimen Description BLOOD RIGHT ANTECUBITAL  Final   Special Requests   Final    BOTTLES DRAWN AEROBIC ONLY Blood Culture results may not be  optimal due to an excessive volume of blood received in culture bottles   Culture   Final    NO GROWTH 2 DAYS Performed at [facility], [address], [place], [place] [address]    Report Status PENDING  Incomplete  Culture, Urine     Status: None   Collection Time: 07/09/18  1:46 AM  Result Value Ref Range Status   Specimen Description URINE, CLEAN CATCH  Final   Special Requests NONE  Final   Culture   Final    NO GROWTH Performed at [facility], [address] [place], [place] [address]    Report Status 07/10/2018 FINAL  Final    Radiology Reports Dg Tibia/fibula Left  Result Date: 07/08/2018 CLINICAL DATA:  The patient suffered a large laceration of the right lower leg due to a fall today. Initial encounter. EXAM: LEFT TIBIA AND FIBULA - 2 VIEW COMPARISON:  None. FINDINGS: Large skin defect is seen along the medial aspect of the lower leg consistent with laceration. No underlying fracture or foreign body. IMPRESSION: Laceration without underlying fracture or foreign body. Electronically Signed   By: Drusilla Kanner M.D.   On: 07/08/2018 16:24   Dg Abd 1 View  Result Date: 07/09/2018 CLINICAL DATA:  Initial evaluation for MRI clearance. EXAM: ABDOMEN - 1 VIEW COMPARISON:  None. FINDINGS: Bowel gas pattern within normal limits without obstruction or ileus. No abnormal bowel wall thickening. No soft tissue mass or abnormal calcification. No metallic implants or other foreign bodies. Lumbar levoscoliosis with moderate to advanced multilevel degenerative spondylolysis. Osteoarthritic changes noted about the hips bilaterally, right greater than left. IMPRESSION: 1. No metallic implants within the abdomen. Patient cleared for MRI. 2. Nonobstructive bowel gas pattern with no radiographic evidence for acute intra-abdominal process. Electronically Signed   By: Rise Mu M.D.   On: 07/09/2018 05:23   Ct Head Wo Contrast  Result Date: 07/08/2018 CLINICAL DATA:  Fall.   Ataxia. EXAM: CT HEAD WITHOUT CONTRAST CT CERVICAL SPINE WITHOUT CONTRAST TECHNIQUE: Multidetector CT imaging of the head and cervical spine was performed following the standard protocol without intravenous contrast. Multiplanar CT image reconstructions of the cervical spine were also generated. COMPARISON:  May 26, 2018 FINDINGS: CT HEAD FINDINGS Brain: No subdural, epidural, or subarachnoid hemorrhage. Ventricles and sulci are prominent but stable. Cerebellum, brainstem, and basal cisterns are normal. White matter changes identified. No acute cortical ischemia or infarct noted. No mass effect or midline shift. Vascular: No hyperdense vessel or unexpected calcification. Skull: Normal. Negative for fracture or focal lesion. Sinuses/Orbits: No acute finding. Other: None. CT CERVICAL SPINE FINDINGS Alignment: Normal. Skull base and vertebrae: No acute fracture. No primary bone lesion or focal pathologic process. Soft tissues and spinal canal: No prevertebral fluid or swelling. No visible canal hematoma. Disc levels:  Multilevel degenerative disc disease. Upper chest: Negative. Other: No other abnormalities. IMPRESSION: 1. No acute intracranial abnormalities. 2. No fracture or traumatic malalignment in the cervical spine. Degenerative changes. Electronically Signed   By: Gerome Sam III M.D   On: 07/08/2018 18:07   Ct Cervical Spine Wo Contrast  Result Date: 07/08/2018 CLINICAL DATA:  Fall.  Ataxia. EXAM: CT HEAD WITHOUT CONTRAST CT CERVICAL SPINE WITHOUT CONTRAST TECHNIQUE: Multidetector CT imaging of the head and cervical spine was performed following the standard protocol without intravenous contrast. Multiplanar CT image reconstructions of the cervical spine were also generated. COMPARISON:  May 26, 2018 FINDINGS: CT HEAD FINDINGS Brain: No subdural, epidural, or subarachnoid hemorrhage. Ventricles and sulci are prominent but stable. Cerebellum, brainstem, and basal cisterns are normal. White matter  changes identified. No acute cortical ischemia or infarct noted. No mass effect or midline shift. Vascular: No hyperdense vessel or unexpected calcification. Skull: Normal. Negative for fracture or focal lesion. Sinuses/Orbits: No acute finding. Other: None. CT CERVICAL SPINE FINDINGS Alignment: Normal. Skull base and vertebrae: No acute fracture. No primary bone lesion or focal pathologic process. Soft tissues and spinal canal: No prevertebral fluid or swelling. No visible canal hematoma. Disc levels:  Multilevel degenerative disc disease. Upper chest:  Negative. Other: No other abnormalities. IMPRESSION: 1. No acute intracranial abnormalities. 2. No fracture or traumatic malalignment in the cervical spine. Degenerative changes. Electronically Signed   By: Gerome Sam III M.D   On: 07/08/2018 18:07   Mr Brain Wo Contrast  Result Date: 07/09/2018 CLINICAL DATA:  84 year old female with increasing confusion. Fall yesterday with leg injury. EXAM: MRI HEAD WITHOUT CONTRAST TECHNIQUE: Multiplanar, multiecho pulse sequences of the brain and surrounding structures were obtained without intravenous contrast. COMPARISON:  Head and cervical spine CT 07/08/2018, 05/26/2018. FINDINGS: Brain: No restricted diffusion to suggest acute infarction. No midline shift, mass effect, evidence of mass lesion, ventriculomegaly, extra-axial collection or acute intracranial hemorrhage. Cervicomedullary junction and pituitary are within normal limits. Patchy and scattered cerebral white matter T2 and FLAIR hyperintensity, mild to moderate for age. No cortical encephalomalacia or chronic. Cerebral blood products identified the deep gray matter nuclei, brainstem, and cerebellum are normal for age. Vascular: Major intracranial vascular flow voids are preserved. Skull and upper cervical spine: Mild for age cervical spine degeneration. Normal bone marrow signal. Sinuses/Orbits: Postoperative changes to both globes, otherwise negative  orbits. Paranasal sinuses and mastoids are stable and well pneumatized. Other: Visible internal auditory structures appear normal. Scalp and face soft tissues appear negative. IMPRESSION: 1.  No acute intracranial abnormality. 2. Mild to moderate for age signal changes in the cerebral white matter, most commonly due to chronic small vessel disease. Electronically Signed   By: Odessa Fleming M.D.   On: 07/09/2018 06:38   Dg Chest Port 1 View  Result Date: 07/08/2018 CLINICAL DATA:  Patient status post fall today. EXAM: PORTABLE CHEST 1 VIEW COMPARISON:  None. FINDINGS: There is a small left pleural effusion and mild left basilar atelectasis. The right lung is clear. No right effusion. No pneumothorax on the right or left. Heart size is upper normal. Aortic atherosclerosis noted. No acute or focal bony abnormality. IMPRESSION: Small left pleural effusion and mild basilar airspace disease which is likely atelectasis rather than pneumonia. Atherosclerosis. Electronically Signed   By: Drusilla Kanner M.D.   On: 07/08/2018 16:23   Vas [place] Lower Extremity Venous (dvt)  Result Date: 07/09/2018  Lower Venous Study Indications: Edema.  Performing Technologist: Blanch Media RVS  Examination Guidelines: A complete evaluation includes B-mode imaging, spectral Doppler, color Doppler, and power Doppler as needed of all accessible portions of each vessel. Bilateral testing is considered an integral part of a complete examination. Limited examinations for reoccurring indications may be performed as noted.  Right Venous Findings: +---------+---------------+---------+-----------+----------+--------------+          CompressibilityPhasicitySpontaneityPropertiesSummary        +---------+---------------+---------+-----------+----------+--------------+ CFV      Full           Yes      Yes                                 +---------+---------------+---------+-----------+----------+--------------+ SFJ      Full                                                         +---------+---------------+---------+-----------+----------+--------------+ FV Prox  Full                                                        +---------+---------------+---------+-----------+----------+--------------+  FV Mid   Full                                                        +---------+---------------+---------+-----------+----------+--------------+ FV DistalFull                                                        +---------+---------------+---------+-----------+----------+--------------+ PFV      Full                                                        +---------+---------------+---------+-----------+----------+--------------+ POP      Full           Yes      Yes                                 +---------+---------------+---------+-----------+----------+--------------+ PTV      Full                                                        +---------+---------------+---------+-----------+----------+--------------+ PERO                                                  not visualized +---------+---------------+---------+-----------+----------+--------------+  Left Venous Findings: +---------+---------------+---------+-----------+----------+--------------+          CompressibilityPhasicitySpontaneityPropertiesSummary        +---------+---------------+---------+-----------+----------+--------------+ CFV      Full           Yes      Yes                                 +---------+---------------+---------+-----------+----------+--------------+ SFJ      Full                                                        +---------+---------------+---------+-----------+----------+--------------+ FV Prox  Full                                                        +---------+---------------+---------+-----------+----------+--------------+ FV Mid   Full                                                         +---------+---------------+---------+-----------+----------+--------------+  FV DistalFull                                                        +---------+---------------+---------+-----------+----------+--------------+ PFV      Full                                                        +---------+---------------+---------+-----------+----------+--------------+ POP      Full           Yes      Yes                                 +---------+---------------+---------+-----------+----------+--------------+ PTV      Full                                                        +---------+---------------+---------+-----------+----------+--------------+ PERO                                                  not visualized +---------+---------------+---------+-----------+----------+--------------+    Summary: Right: There is no evidence of deep vein thrombosis in the lower extremity. No cystic structure found in the popliteal fossa. Left: There is no evidence of deep vein thrombosis in the lower extremity. No cystic structure found in the popliteal fossa.  *See table(s) above for measurements and observations. Electronically signed by Fabienne Brunsharles Fields MD on 07/09/2018 at 5:38:43 PM.    Final     Lab Data:  CBC: Recent Labs  Lab 07/08/18 1627 07/09/18 0704 07/10/18 0342 07/11/18 0440  WBC 12.8* 10.9* 8.1 6.8  NEUTROABS 11.5*  --  5.8 4.8  HGB 10.9* 9.7* 9.5* 8.9*  HCT 34.2* 31.2* 30.2* 28.8*  MCV 85.3 84.1 84.4 84.2  PLT 309 322 327 305   Basic Metabolic Panel: Recent Labs  Lab 07/08/18 1627 07/08/18 2356 07/09/18 0704 07/10/18 0342 07/11/18 0440  NA 138  --  138 138 139  K 3.6  --  3.7 3.1* 3.5  CL 104  --  103 106 106  CO2 24  --  21* 23 24  GLUCOSE 149*  --  146* 106* 143*  BUN 22  --  20 13 10   CREATININE 1.00  --  1.23* 1.07* 0.89  CALCIUM 8.2*  --  7.9* 7.7* 7.7*  MG  --  1.7  --   --   --    GFR: Estimated Creatinine Clearance:  36.8 mL/min (by C-G formula based on SCr of 0.89 mg/dL). Liver Function Tests: Recent Labs  Lab 07/08/18 1627  AST 112*  ALT 51*  ALKPHOS 153*  BILITOT 1.0  PROT 5.3*  ALBUMIN 2.5*   No results for input(s): LIPASE, AMYLASE in the last 168 hours. No results for input(s): AMMONIA in the last 168 hours. Coagulation Profile: No results for  input(s): INR, PROTIME in the last 168 hours. Cardiac Enzymes: Recent Labs  Lab 07/08/18 1627 07/08/18 2356 07/09/18 0704 07/09/18 1156 07/10/18 0342 07/11/18 0440  CKTOTAL 3,163*  --  2,602*  --  949* 448*  TROPONINI 0.79* 1.39* 1.14* 0.96*  --   --    BNP (last 3 results) No results for input(s): PROBNP in the last 8760 hours. HbA1C: No results for input(s): HGBA1C in the last 72 hours. CBG: Recent Labs  Lab 07/11/18 1138  GLUCAP 157*   Lipid Profile: No results for input(s): CHOL, HDL, LDLCALC, TRIG, CHOLHDL, LDLDIRECT in the last 72 hours. Thyroid Function Tests: Recent Labs    07/08/18 2356  TSH 2.939   Anemia Panel: No results for input(s): VITAMINB12, FOLATE, FERRITIN, TIBC, IRON, RETICCTPCT in the last 72 hours. Urine analysis:    Component Value Date/Time   COLORURINE YELLOW 07/08/2018 1922   APPEARANCEUR HAZY (A) 07/08/2018 1922   LABSPEC 1.019 07/08/2018 1922   PHURINE 5.0 07/08/2018 1922   GLUCOSEU 150 (A) 07/08/2018 1922   HGBUR MODERATE (A) 07/08/2018 1922   BILIRUBINUR NEGATIVE 07/08/2018 1922   KETONESUR 20 (A) 07/08/2018 1922   PROTEINUR NEGATIVE 07/08/2018 1922   NITRITE NEGATIVE 07/08/2018 1922   LEUKOCYTESUR LARGE (A) 07/08/2018 1922     Ripudeep Rai M.D. [facility] 07/11/2018, 1:34 PM  Pager: [phone] Between 7am to 7pm - call Pager - [phone]  After 7pm go to [email or web address] - password [email or web address]  Call night coverage person covering after 7pm

## 2018-07-11 NOTE — Progress Notes (Signed)
Pt w/5beat run of vtach, asymptomatic, VSS, NAD noted. Dr. Isidoro Donningai notified. No new orders received at this time.

## 2018-07-11 NOTE — Consult Note (Signed)
   [facility]   07/11/2018  Gay FillerMary Lynch 1928/10/12 [id number]   Patient screened for potential [facility] Care Management services. Patient is in the [facility] under patient's Medicare plan.  Chart review reveals patient is DNR and daughter desires for SNF with outpatient palliative care noted in Palliative consult notes. No needs assessed for [facility] at this time. For questions contact:   Charlesetta ShanksVictoria Nnenna Meador, RN BSN CCM [facility] Liaison  [phone] business mobile phone Toll free office [phone]

## 2018-07-11 NOTE — Plan of Care (Signed)
Patient stable, discussed POC with patient and grand daughter, agreeable with plan, denies question/concerns at this time.

## 2018-07-11 NOTE — Clinical Social Work Note (Signed)
Clinical Social Work Assessment  Patient Details  Name: Patricia Lynch MRN: [id number] Date of Birth: May 02, 1929  Date of referral:  07/11/18               Reason for consult:  Facility Placement                Permission sought to share information with:  Facility Medical sales representativeContact Representative, Family Supports Permission granted to share information::  Yes, Verbal Permission Granted  Name::     Designer, fashion/clothingDebra  Agency::  SNF  Relationship::  Daughter  Contact Information:     Housing/Transportation Living arrangements for the past 2 months:  Single Family Home Source of Information:  Medical Team, Adult Children Patient Interpreter Needed:  None Criminal Activity/Legal Involvement Pertinent to Current Situation/Hospitalization:  No - Comment as needed Significant Relationships:  Adult Children Lives with:  Self Do you feel safe going back to the place where you live?  Yes Need for family participation in patient care:  Yes (Comment)  Care giving concerns:  Patient from home alone, will need short term rehab at discharge.   Social Worker assessment / plan:  CSW spoke with patient's daughter over the phone to discuss recommendation for SNF. Patient's daughter agreeable to SNF, gave permission to fax out. CSW emailed patient's daughter a list of facility options. CSW to follow.   Employment status:  Retired Health and safety inspectornsurance information:  Medicare PT Recommendations:  Skilled Nursing Facility Information / Referral to community resources:  Skilled Nursing Facility  Patient/Family's Response to care:  Patient's daughter agreeable to SNF placement.  Patient/Family's Understanding of and Emotional Response to Diagnosis, Current Treatment, and Prognosis:  Patient's daughter discussed how this was new for them, she's never been to rehab before so she doesn't know what to expect. Patient's daughter appreciative of CSW assistance. Patient's daughter hopeful that the patient will be able to improve and return back  home after rehab.  Emotional Assessment Appearance:  Appears stated age Attitude/Demeanor/Rapport:  Unable to Assess Affect (typically observed):  Unable to Assess Orientation:  Oriented to Self, Oriented to Place, Oriented to  Time, Oriented to Situation Alcohol / Substance use:  Not Applicable Psych involvement (Current and /or in the community):  No (Comment)  Discharge Needs  Concerns to be addressed:  Care Coordination Readmission within the last 30 days:  No Current discharge risk:  Dependent with Mobility, Lives alone, Cognitively Impaired Barriers to Discharge:  Continued Medical Work up, Insurance Authorization   Baldemar Lenislizabeth M Emlyn Maves, LCSW 07/11/2018, 11:33 AM

## 2018-07-11 NOTE — Progress Notes (Signed)
Daily Progress Note   Patient Name: Patricia Lynch       Date: 07/11/2018 DOB: [birth year]  Age: 84 y.o. MRN#: [id number] Attending Physician: Cathren Harshai, Ripudeep K, MD Primary Care Physician: Jarome MatinPaterson, Daniel, MD Admit Date: 07/08/2018  Reason for Consultation/Follow-up: Establishing goals of care  Subjective: Resting in bed, leg pain is better  Length of Stay: 2  Current Medications: Scheduled Meds:  . aspirin EC  81 mg Oral Daily  . [START ON 07/12/2018] enoxaparin (LOVENOX) injection  40 mg Subcutaneous Q24H  . losartan  50 mg Oral QHS  . mupirocin ointment   Topical Daily    Continuous Infusions: . cefTRIAXone (ROCEPHIN)  IV Stopped (07/11/18 0837)    PRN Meds: acetaminophen **OR** acetaminophen, LORazepam, morphine injection, ondansetron **OR** ondansetron (ZOFRAN) IV  Physical Exam   Constitutional: She is oriented to person, place, and time. No distress.  HENT:  Head: Normocephalic and atraumatic.  Cardiovascular: Normal rate and regular rhythm.  Pulmonary/Chest: Effort normal and breath sounds normal. No respiratory distress.  Abdominal: Soft. Bowel sounds are normal.  Musculoskeletal:  Mild BLE edema, R ankle wrapped in gauze  Neurological: She is alert and oriented to person, place, and time.  Skin: Skin is warm and dry. She is not diaphoretic.  Psychiatric: Cognition and memory are impaired.        Vital Signs: BP (!) 129/58 (BP Location: Left Arm)   Pulse 70   Temp 98.5 F (36.9 C) (Axillary)   Resp 15   Ht 5\' 3"  (1.6 m)   Wt 52.4 kg   SpO2 94%   BMI 20.46 kg/m  SpO2: SpO2: 94 % O2 Device: O2 Device: Room Air O2 Flow Rate:    Intake/output summary:   Intake/Output Summary (Last 24 hours) at 07/11/2018 0847 Last data filed at 07/11/2018 0000 Gross per 24 hour  Intake 120  ml  Output 50 ml  Net 70 ml   LBM: Last BM Date: 07/09/18 Baseline Weight: Weight: 52.4 kg Most recent weight: Weight: 52.4 kg       Palliative Assessment/Data: PPS 50%   Flowsheet Rows     Most Recent Value  Intake Tab  Referral Department  Hospitalist  Unit at Time of Referral  Cardiac/Telemetry Unit  Palliative Care Primary Diagnosis  Neurology  Date Notified  07/09/18  Palliative Care Type  New Palliative care  Reason for referral  Clarify Goals of Care  Date of Admission  07/08/18  Date first seen by Palliative Care  07/10/18  # of days Palliative referral response time  1 Day(s)  # of days IP prior to Palliative referral  1  Clinical Assessment  Palliative Performance Scale Score  50%  Psychosocial & Spiritual Assessment  Palliative Care Outcomes  Patient/Family meeting held?  Yes  Who was at the meeting?  patient and daughter  Palliative Care Outcomes  Improved pain interventions, Clarified goals of care, Provided advance care planning, Provided psychosocial or spiritual support, Linked to palliative care logitudinal support      Patient Active Problem List   Diagnosis Date Noted  . Pressure injury of skin 07/10/2018  . Goals of care, counseling/discussion   . Palliative care by specialist   .  Dementia (HCC)   . Acute encephalopathy 07/08/2018  . Troponin level elevated 07/08/2018  . Hypothermia 07/08/2018  . Acute lower UTI 07/08/2018  . Non-traumatic rhabdomyolysis 05/26/2018  . Essential hypertension 05/26/2018  . Hyperlipidemia 05/26/2018  . Mild cognitive impairment 05/26/2018  . Renal insufficiency 05/26/2018  . Right arm pain 05/26/2018    Palliative Care Assessment & Plan   HPI: 84 y.o. female  with past medical history of dementia, frequent falls, HLD, HTN, CKD, and anemia admitted on 07/08/2018 after a fall at home. She lacerated her right leg.   CK levels were around 3000 with mildly elevated troponin. EKG showing normal sinus rhythm. Echo  revealed normal systolic function. CT head and C-spine were unremarkable,chest x-ray was unremarkable. PMT consulted for GOC d/t dementia and frequent falls.  Assessment: F/u on patient. Leg pain is better. Seen by PT - rec SNF - patient and daughter agreeable. Will place CSW consult.   Recommendations/Plan: - Code status changed to DNR per patient/family wishes - Daughter cannot provide 24 hr supervision - thinks rehab will be needed - recommended by PT - will place CSW consult - will add prn morphine for pain for breakthrough pain in addition to tylenol - discharging MD please write for outpatient palliative in discharge summary - not yet hospice appropriate  Code Status:  DNR  Prognosis:   Unable to determine  Discharge Planning:  Skilled Nursing Facility for rehab with Palliative care service follow-up  Care plan was discussed with Patient and daughter  Thank you for allowing the Palliative Medicine Team to assist in the care of this patient.   Total Time 15 minutes Prolonged Time Billed  no       Greater than 50%  of this time was spent counseling and coordinating care related to the above assessment and plan.  Gerlean Ren, DNP, [facility] Palliative Medicine Team Team Phone # [phone]  Pager [phone]

## 2018-07-11 NOTE — [place] FL2 (Signed)
MEDICAID FL2 LEVEL OF CARE SCREENING TOOL     IDENTIFICATION  Patient Name: Patricia Lynch Birthdate: [birth year] Sex: female Admission Date (Current Location): 07/08/2018  [facility] [place]   Facility and Address:  [facility], [address], [place], [place] [address]      Provider Number: [id number]  Attending Physician Name and Address:  Cathren Harshai, Ripudeep K, MD  Relative Name and Phone Number:       Current Level of Care: Hospital Recommended Level of Care: Skilled Nursing Facility Prior Approval Number:    Date Approved/Denied:   PASRR Number: [id number]  Discharge Plan: SNF    Current Diagnoses: Patient Active Problem List   Diagnosis Date Noted  . Pressure injury of skin 07/10/2018  . Goals of care, counseling/discussion   . Palliative care by specialist   . Dementia (HCC)   . Acute encephalopathy 07/08/2018  . Troponin level elevated 07/08/2018  . Hypothermia 07/08/2018  . Acute lower UTI 07/08/2018  . Non-traumatic rhabdomyolysis 05/26/2018  . Essential hypertension 05/26/2018  . Hyperlipidemia 05/26/2018  . Mild cognitive impairment 05/26/2018  . Renal insufficiency 05/26/2018  . Right arm pain 05/26/2018    Orientation RESPIRATION BLADDER Height & Weight     Self, Time, Situation, Place  Normal Incontinent Weight: 115 lb 8.3 oz (52.4 kg) Height:  5\' 3"  (160 cm)  BEHAVIORAL SYMPTOMS/MOOD NEUROLOGICAL BOWEL NUTRITION STATUS      Continent Diet(heart healthy)  AMBULATORY STATUS COMMUNICATION OF NEEDS Skin   Extensive Assist Verbally PU Stage and Appropriate Care, Other (Comment)(left leg laceration, sutured with steri strips and gauze dressing changed PRN)     PU Stage 3 Dressing: (coccyx, foam dressing changed PRN)                 Personal Care Assistance Level of Assistance  Bathing, Feeding, Dressing Bathing Assistance: Maximum assistance Feeding assistance: Limited assistance Dressing  Assistance: Maximum assistance     Functional Limitations Info  Sight, Hearing, Speech Sight Info: Adequate Hearing Info: Adequate Speech Info: Adequate    SPECIAL CARE FACTORS FREQUENCY  PT (By licensed PT), OT (By licensed OT)     PT Frequency: 5x/wk OT Frequency: 5x/wk            Contractures Contractures Info: Not present    Additional Factors Info  Code Status, Allergies Code Status Info: DNR Allergies Info: Beef-derived Products, Other (Some vegetables cause nausea)           Current Medications (07/11/2018):  This is the current hospital active medication list Current Facility-Administered Medications  Medication Dose Route Frequency Provider Last Rate Last Dose  . acetaminophen (TYLENOL) tablet 650 mg  650 mg Oral Q6H PRN Eduard ClosKakrakandy, Arshad N, MD   650 mg at 07/10/18 1257   Or  . acetaminophen (TYLENOL) suppository 650 mg  650 mg Rectal Q6H PRN Eduard ClosKakrakandy, Arshad N, MD      . aspirin EC tablet 81 mg  81 mg Oral Daily Eduard ClosKakrakandy, Arshad N, MD   81 mg at 07/10/18 [id number]  . cefTRIAXone (ROCEPHIN) 1 g in sodium chloride 0.9 % 100 mL IVPB  1 g Intravenous Q24H Eduard ClosKakrakandy, Arshad N, MD   Stopped at 07/11/18 [phone]  . [START ON 07/12/2018] enoxaparin (LOVENOX) injection 40 mg  40 mg Subcutaneous Q24H Pierce, Dwayne A, RPH      . LORazepam (ATIVAN) injection 0.5 mg  0.5 mg Intravenous Once PRN Blount, Janalyn RouseXenia T, NP      .  losartan (COZAAR) tablet 50 mg  50 mg Oral QHS Eduard Clos, MD   50 mg at 07/10/18 2340  . morphine 2 MG/ML injection 1 mg  1 mg Intravenous Q3H PRN Joylene Draft, NP      . mupirocin ointment (BACTROBAN) 2 %   Topical Daily Rai, Ripudeep K, MD      . ondansetron (ZOFRAN) tablet 4 mg  4 mg Oral Q6H PRN Eduard Clos, MD       Or  . ondansetron [facility]) injection 4 mg  4 mg Intravenous Q6H PRN Eduard Clos, MD         Discharge Medications: Please see discharge summary for a list of discharge medications.  Relevant Imaging  Results:  Relevant Lab Results:   Additional Information SS#: [id number]  Baldemar Lenis, LCSW

## 2018-07-12 DIAGNOSIS — Z7189 Other specified counseling: Secondary | ICD-10-CM

## 2018-07-12 DIAGNOSIS — F0390 Unspecified dementia without behavioral disturbance: Secondary | ICD-10-CM | POA: Diagnosis not present

## 2018-07-12 DIAGNOSIS — N3000 Acute cystitis without hematuria: Secondary | ICD-10-CM | POA: Diagnosis not present

## 2018-07-12 DIAGNOSIS — E440 Moderate protein-calorie malnutrition: Secondary | ICD-10-CM | POA: Diagnosis not present

## 2018-07-12 DIAGNOSIS — L97813 Non-pressure chronic ulcer of other part of right lower leg with necrosis of muscle: Secondary | ICD-10-CM | POA: Diagnosis not present

## 2018-07-12 DIAGNOSIS — T148XXD Other injury of unspecified body region, subsequent encounter: Secondary | ICD-10-CM | POA: Diagnosis not present

## 2018-07-12 DIAGNOSIS — R296 Repeated falls: Secondary | ICD-10-CM | POA: Diagnosis not present

## 2018-07-12 DIAGNOSIS — R2689 Other abnormalities of gait and mobility: Secondary | ICD-10-CM | POA: Diagnosis not present

## 2018-07-12 DIAGNOSIS — L97822 Non-pressure chronic ulcer of other part of left lower leg with fat layer exposed: Secondary | ICD-10-CM | POA: Diagnosis not present

## 2018-07-12 DIAGNOSIS — R41841 Cognitive communication deficit: Secondary | ICD-10-CM | POA: Diagnosis not present

## 2018-07-12 DIAGNOSIS — R278 Other lack of coordination: Secondary | ICD-10-CM | POA: Diagnosis not present

## 2018-07-12 DIAGNOSIS — F0151 Vascular dementia with behavioral disturbance: Secondary | ICD-10-CM | POA: Diagnosis not present

## 2018-07-12 DIAGNOSIS — N390 Urinary tract infection, site not specified: Secondary | ICD-10-CM | POA: Diagnosis not present

## 2018-07-12 DIAGNOSIS — I10 Essential (primary) hypertension: Secondary | ICD-10-CM | POA: Diagnosis not present

## 2018-07-12 DIAGNOSIS — S81801A Unspecified open wound, right lower leg, initial encounter: Secondary | ICD-10-CM | POA: Diagnosis not present

## 2018-07-12 DIAGNOSIS — Z9181 History of falling: Secondary | ICD-10-CM | POA: Diagnosis not present

## 2018-07-12 DIAGNOSIS — Z79899 Other long term (current) drug therapy: Secondary | ICD-10-CM | POA: Diagnosis not present

## 2018-07-12 DIAGNOSIS — W19XXXA Unspecified fall, initial encounter: Secondary | ICD-10-CM | POA: Diagnosis not present

## 2018-07-12 DIAGNOSIS — Y999 Unspecified external cause status: Secondary | ICD-10-CM | POA: Diagnosis not present

## 2018-07-12 DIAGNOSIS — S81811A Laceration without foreign body, right lower leg, initial encounter: Secondary | ICD-10-CM | POA: Diagnosis not present

## 2018-07-12 DIAGNOSIS — N189 Chronic kidney disease, unspecified: Secondary | ICD-10-CM | POA: Diagnosis not present

## 2018-07-12 DIAGNOSIS — R2681 Unsteadiness on feet: Secondary | ICD-10-CM | POA: Diagnosis not present

## 2018-07-12 DIAGNOSIS — Y33XXXA Other specified events, undetermined intent, initial encounter: Secondary | ICD-10-CM | POA: Diagnosis not present

## 2018-07-12 DIAGNOSIS — M6281 Muscle weakness (generalized): Secondary | ICD-10-CM | POA: Diagnosis not present

## 2018-07-12 DIAGNOSIS — L98429 Non-pressure chronic ulcer of back with unspecified severity: Secondary | ICD-10-CM | POA: Diagnosis not present

## 2018-07-12 DIAGNOSIS — I129 Hypertensive chronic kidney disease with stage 1 through stage 4 chronic kidney disease, or unspecified chronic kidney disease: Secondary | ICD-10-CM | POA: Diagnosis not present

## 2018-07-12 DIAGNOSIS — I5189 Other ill-defined heart diseases: Secondary | ICD-10-CM | POA: Diagnosis not present

## 2018-07-12 DIAGNOSIS — I214 Non-ST elevation (NSTEMI) myocardial infarction: Secondary | ICD-10-CM | POA: Diagnosis not present

## 2018-07-12 DIAGNOSIS — S81811D Laceration without foreign body, right lower leg, subsequent encounter: Secondary | ICD-10-CM | POA: Diagnosis not present

## 2018-07-12 DIAGNOSIS — L22 Diaper dermatitis: Secondary | ICD-10-CM | POA: Diagnosis not present

## 2018-07-12 DIAGNOSIS — L89153 Pressure ulcer of sacral region, stage 3: Secondary | ICD-10-CM | POA: Diagnosis not present

## 2018-07-12 DIAGNOSIS — G9340 Encephalopathy, unspecified: Secondary | ICD-10-CM | POA: Diagnosis not present

## 2018-07-12 DIAGNOSIS — L89152 Pressure ulcer of sacral region, stage 2: Secondary | ICD-10-CM | POA: Diagnosis not present

## 2018-07-12 DIAGNOSIS — R262 Difficulty in walking, not elsewhere classified: Secondary | ICD-10-CM | POA: Diagnosis not present

## 2018-07-12 DIAGNOSIS — Z23 Encounter for immunization: Secondary | ICD-10-CM | POA: Diagnosis not present

## 2018-07-12 DIAGNOSIS — Y939 Activity, unspecified: Secondary | ICD-10-CM | POA: Diagnosis not present

## 2018-07-12 DIAGNOSIS — R4189 Other symptoms and signs involving cognitive functions and awareness: Secondary | ICD-10-CM | POA: Diagnosis not present

## 2018-07-12 DIAGNOSIS — Z7401 Bed confinement status: Secondary | ICD-10-CM | POA: Diagnosis not present

## 2018-07-12 DIAGNOSIS — T796XXD Traumatic ischemia of muscle, subsequent encounter: Secondary | ICD-10-CM | POA: Diagnosis not present

## 2018-07-12 DIAGNOSIS — Y92009 Unspecified place in unspecified non-institutional (private) residence as the place of occurrence of the external cause: Secondary | ICD-10-CM | POA: Diagnosis not present

## 2018-07-12 DIAGNOSIS — N898 Other specified noninflammatory disorders of vagina: Secondary | ICD-10-CM | POA: Diagnosis not present

## 2018-07-12 DIAGNOSIS — M2550 Pain in unspecified joint: Secondary | ICD-10-CM | POA: Diagnosis not present

## 2018-07-12 DIAGNOSIS — Y929 Unspecified place or not applicable: Secondary | ICD-10-CM | POA: Diagnosis not present

## 2018-07-12 LAB — BASIC METABOLIC PANEL
Anion gap: 12 (ref 5–15)
BUN: 7 mg/dL — ABNORMAL LOW (ref 8–23)
CO2: 23 mmol/L (ref 22–32)
Calcium: 7.9 mg/dL — ABNORMAL LOW (ref 8.9–10.3)
Chloride: 102 mmol/L (ref 98–111)
Creatinine, Ser: 0.81 mg/dL (ref 0.44–1.00)
GFR calc Af Amer: >60 mL/min (ref >60)
GFR calc non Af Amer: >60 mL/min (ref >60)
Glucose, Bld: 145 mg/dL — ABNORMAL HIGH (ref 70–99)
Potassium: 4 mmol/L (ref 3.5–5.1)
Sodium: 137 mmol/L (ref 135–145)

## 2018-07-12 LAB — CBC WITH DIFFERENTIAL/PLATELET
ABS IMMATURE GRANULOCYTES: 0.04 10*3/uL (ref 0.00–0.07)
Basophils Absolute: 0 10*3/uL (ref 0.0–0.1)
Basophils Relative: 0 %
Eosinophils Absolute: 0.2 10*3/uL (ref 0.0–0.5)
Eosinophils Relative: 3 %
HCT: 30.3 % — ABNORMAL LOW (ref 36.0–46.0)
Hemoglobin: 9.3 g/dL — ABNORMAL LOW (ref 12.0–15.0)
Immature Granulocytes: 1 %
Lymphocytes Relative: 19 %
Lymphs Abs: 1.4 10*3/uL (ref 0.7–4.0)
MCH: 25.8 pg — ABNORMAL LOW (ref 26.0–34.0)
MCHC: 30.7 g/dL (ref 30.0–36.0)
MCV: 84.2 fL (ref 80.0–100.0)
Monocytes Absolute: 0.7 10*3/uL (ref 0.1–1.0)
Monocytes Relative: 9 %
Neutro Abs: 5 10*3/uL (ref 1.7–7.7)
Neutrophils Relative %: 68 %
Platelets: 364 10*3/uL (ref 150–400)
RBC: 3.6 MIL/uL — ABNORMAL LOW (ref 3.87–5.11)
RDW: 14.6 % (ref 11.5–15.5)
WBC: 7.4 10*3/uL (ref 4.0–10.5)
nRBC: 0 % (ref 0.0–0.2)

## 2018-07-12 LAB — GLUCOSE, CAPILLARY: Glucose-Capillary: 154 mg/dL — ABNORMAL HIGH (ref 70–99)

## 2018-07-12 LAB — CK: Total CK: 273 U/L — ABNORMAL HIGH (ref 38–234)

## 2018-07-12 MED ORDER — MUPIROCIN 2 % EX OINT: TOPICAL_OINTMENT | Freq: Every day | CUTANEOUS | 0 refills | Status: AC

## 2018-07-12 NOTE — Progress Notes (Signed)
Physical Therapy Treatment Patient Details Name: Patricia Lynch MRN: [id number] DOB: 01-Apr-1929 Today's Date: 07/12/2018    History of Present Illness Pt is a 84 y/o female with a PMH consisting of HLD, HTN, dementia and falls presents to the Ed after a fall at home with weakness and confusion. MRI on 07/09/2018 revealed No acute intracranial abnormality, and x-ray on 07/08/2018 showed no fx for L tibia/fibula.     PT Comments    Pt demonstrates progression towards goals. Pt is overall mod A for bed mobility, max A for sit to stand, and max A x2 with HHA and the use of a bed pad for a squat pivot transfer. Pt able to progress sitting balance without UE support, but fatigues quickly after completing 1 sit to stand. Pt states continued LLE pain throughout the session, as well as a fear of falling. Pt would continue to benefit from acute PT in order to increase functional mobility, strength, and balance.     Follow Up Recommendations  SNF     Equipment Recommendations  Rolling walker with 5" wheels    Recommendations for Other Services       Precautions / Restrictions Precautions Precautions: Fall Restrictions Weight Bearing Restrictions: No    Mobility  Bed Mobility Overal bed mobility: Needs Assistance Bed Mobility: Supine to Sit     Supine to sit: Mod assist     General bed mobility comments: Pt requires BLE support, although pt is able to assist. Pt requires support for trunk elevation. Use of bed pad needed for facilitating hips to the EOB.   Transfers Overall transfer level: Needs assistance Equipment used: Rolling walker (2 wheeled);2 person hand held assist Transfers: Sit to/from [employer] Sit to Stand: Max assist;From elevated surface   Squat pivot transfers: +2 physical assistance;Max assist     General transfer comment: Pt required power initiation and trunk support during sit to stand. Pt requires verbal and tactile cueing for BUE placement and BLE  sequencing. Pt requires BLE placement prior to squat pivot. Pt required use of a walker with max A for sit to stand, while a 2 person HHA with max A x2 and the use of a bed pad was used for the squat pivot.   Ambulation/Gait                 Stairs             Wheelchair Mobility    Modified Rankin (Stroke Patients Only)       Balance Overall balance assessment: Needs assistance Sitting-balance support: No upper extremity supported;Feet supported Sitting balance-Leahy Scale: Fair Sitting balance - Comments: Pt able to tolerate sitting balance without UE support. Pt requires min A after fatiguing.    Standing balance support: Bilateral upper extremity supported;During functional activity Standing balance-Leahy Scale: Poor Standing balance comment: Pt reliant on BUE support and mod A.                             Cognition Arousal/Alertness: Awake/alert Behavior During Therapy: Anxious Overall Cognitive Status: Impaired/Different from baseline Area of Impairment: Awareness;Safety/judgement;Problem solving                         Safety/Judgement: Decreased awareness of safety;Decreased awareness of deficits Awareness: Emergent Problem Solving: Slow processing;Decreased initiation;Difficulty sequencing;Requires verbal cues;Requires tactile cues        Exercises      General  Comments        Pertinent Vitals/Pain Pain Assessment: Faces Faces Pain Scale: Hurts even more Pain Location: LLE Pain Descriptors / Indicators: Aching;Sharp Pain Intervention(s): Monitored during session    Home Living                      Prior Function            PT Goals (current goals can now be found in the care plan section) Acute Rehab PT Goals Patient Stated Goal: to feel better PT Goal Formulation: With patient Time For Goal Achievement: 07/24/18 Potential to Achieve Goals: Good Progress towards PT goals: Progressing toward goals     Frequency    Min 2X/week      PT Plan Current plan remains appropriate    Co-evaluation              AM-PAC PT "6 Clicks" Mobility   Outcome Measure  Help needed turning from your back to your side while in a flat bed without using bedrails?: A Lot Help needed moving from lying on your back to sitting on the side of a flat bed without using bedrails?: A Lot Help needed moving to and from a bed to a chair (including a wheelchair)?: A Lot Help needed standing up from a chair using your arms (e.g., wheelchair or bedside chair)?: A Lot Help needed to walk in hospital room?: Total Help needed climbing 3-5 steps with a railing? : Total 6 Click Score: 10    End of Session Equipment Utilized During Treatment: Gait belt Activity Tolerance: Patient tolerated treatment well Patient left: in chair;with call bell/phone within reach;with chair alarm set Nurse Communication: Mobility status PT Visit Diagnosis: Unsteadiness on feet (R26.81);Muscle weakness (generalized) (M62.81);History of falling (Z91.81)     Time: [id number] PT Time Calculation (min) (ACUTE ONLY): 24 min  Charges:  $Therapeutic Activity: 23-37 mins                     Rolm BookbinderBeau Lawrence, SPT Acute Rehab [phone] (pager) [phone] (office)    Jeanine Caven 07/12/2018, 12:15 PM

## 2018-07-12 NOTE — Progress Notes (Signed)
CSW following for discharge plan. Spoke with patient's daughter and received email with facility preferences. Family would prefer [facility]; referral is still pending. CSW reached out to Admissions to check on referral and ability to offer a bed for the patient.  CSW to follow.  Blenda NicelyElizabeth Markeya Min[place], [place] Clinical Social Worker [phone]

## 2018-07-12 NOTE — Discharge Summary (Signed)
Physician Discharge Summary   Patient ID: Margie Urbanowicz MRN: [id number] DOB/AGE: [birth year] 84 y.o.  Admit date: 07/08/2018 Discharge date: 07/12/2018  Primary Care Physician:  Jarome Matin, MD   Recommendations for Outpatient Follow-up:  1. Follow up with PCP in 1-2 weeks 2. Outpatient palliative follow up at the skilled nursing facility  Home Health: Patient being discharged to skilled nursing facility Equipment/Devices:   Discharge Condition: Stable CODE STATUS: DNR Diet recommendation: Heart healthy diet   Discharge Diagnoses:    . Acute metabolic encephalopathy superimposed on dementia . Essential hypertension . Acute lower UTI . Failure to thrive Nontraumatic rhabdomyolysis Essential hypertension Troponin elevated likely due to demand ischemia Hypothermia resolved Pressure injury of the skin, laceration of the right leg, stage III sacral decub  Consults: Cardiology Palliative medicine    Allergies:   Allergies  Allergen Reactions  . Beef-Derived Products Nausea Only  . Other Nausea Only    Some vegetables     DISCHARGE MEDICATIONS: Allergies as of 07/12/2018      Reactions   Beef-derived Products Nausea Only   Other Nausea Only   Some vegetables      Medication List    STOP taking these medications   lovastatin 10 MG tablet Commonly known as:  MEVACOR     TAKE these medications   acetaminophen 500 MG tablet Commonly known as:  TYLENOL Take by mouth every 6 (six) hours as needed for moderate pain.   BIOFREEZE EX Apply 1 application topically daily as needed (back pain).   losartan 50 MG tablet Commonly known as:  COZAAR Take 50 mg by mouth at bedtime.   mupirocin ointment 2 % Commonly known as:  BACTROBAN Apply topically daily. Cleanse wounds to left anterior lower leg, left malleolus and left hip with NS and pat dry. Apply mupirocin ointment and cover with dry dressing daily.        Brief H and P: For complete details please  refer to admission H and P, but in brief Kelty Riceis a 84 y.o.femalewithhistory of hypertension,hyperlipidemia was brought to the ER after patient had a fall. As per the patient's daughter patient had a fallabout 2 days agoand has been lying on the floor since then. Patient tripped onto her main door and was caught in between. She lacerated her right leg, withbleeding from the wound. When patient's daughter went to check on her, ptwaslyingon the floor,conscious,weak and confused. In the ER patient was initially hypothermic which improved with bear hugger. UA showing features concerning for UTI. CK levels were around 3000 with mildly elevated troponin EKG showing normal sinus rhythm. CT head and C-spine were unremarkable,chest x-ray was unremarkable.Pt admitted for further management.   Hospital Course:   Acute metabolic encephalopathy likely has underlying dementia -Likely multifactorial including UTI, dehydration, failure to thrive, hypothermia, UTI -Chest x-ray showed atelectasis otherwise no pneumonia, CT head, cervical spine showed no acute abnormality.  MRI brain showed no acute stroke , has mild to moderate chronic small vessel disease. -Mental status slowly improving, has underlying dementia, appears close to her baseline mental status     Non-traumatic rhabdomyolysis Likely due to fall, CKs are improving, 273 at the time of discharge, patient was placed on IV fluid hydration    Acute lower UTI -Urine culture showed no growth, completed ceftriaxone for 3 doses    Essential hypertension -BP currently stable    Troponin level elevated/ NSTEMI -Likely demand ischemia, cardiology was consulted, recommended no further interventions and address goals of care 2D  echo showed EF normal, wall motion normal, grade 1 diastolic dysfunction    Hypothermia -Resolved    Dementia (HCC) failure to thrive -Palliative medicine consulted, DNR, outpatient palliative follow-up  at the skilled nursing facility    Pressure injury of skin -Laceration of the right leg with stage III sacral decub -Wound care was consulted, wound care per instructions   Day of Discharge S: No acute issues overnight, no nausea vomiting or abdominal pain or diarrhea, no fevers.  BP (!) 134/59 (BP Location: Left Arm)   Pulse 75   Temp 98.1 F (36.7 C) (Oral)   Resp 16   Ht 5\' 3"  (1.6 m)   Wt 52.4 kg   SpO2 91%   BMI 20.46 kg/m   Physical Exam: General: Alert and awake oriented x2, NAD, pleasant and cooperative HEENT: anicteric sclera, pupils reactive to light and accommodation CVS: S1-S2 clear no murmur rubs or gallops Chest: clear to auscultation bilaterally, no wheezing rales or rhonchi Abdomen: soft nontender, nondistended, normal bowel sounds Extremities: Right lower extremity dressing intact Neuro: No new deficits   The results of significant diagnostics from this hospitalization (including imaging, microbiology, ancillary and laboratory) are listed below for reference.      Procedures/Studies:  Dg Tibia/fibula Left  Result Date: 07/08/2018 CLINICAL DATA:  The patient suffered a large laceration of the right lower leg due to a fall today. Initial encounter. EXAM: LEFT TIBIA AND FIBULA - 2 VIEW COMPARISON:  None. FINDINGS: Large skin defect is seen along the medial aspect of the lower leg consistent with laceration. No underlying fracture or foreign body. IMPRESSION: Laceration without underlying fracture or foreign body. Electronically Signed   By: Drusilla Kanner M.D.   On: 07/08/2018 16:24   Dg Abd 1 View  Result Date: 07/09/2018 CLINICAL DATA:  Initial evaluation for MRI clearance. EXAM: ABDOMEN - 1 VIEW COMPARISON:  None. FINDINGS: Bowel gas pattern within normal limits without obstruction or ileus. No abnormal bowel wall thickening. No soft tissue mass or abnormal calcification. No metallic implants or other foreign bodies. Lumbar levoscoliosis with moderate  to advanced multilevel degenerative spondylolysis. Osteoarthritic changes noted about the hips bilaterally, right greater than left. IMPRESSION: 1. No metallic implants within the abdomen. Patient cleared for MRI. 2. Nonobstructive bowel gas pattern with no radiographic evidence for acute intra-abdominal process. Electronically Signed   By: Rise Mu M.D.   On: 07/09/2018 05:23   Ct Head Wo Contrast  Result Date: 07/08/2018 CLINICAL DATA:  Fall.  Ataxia. EXAM: CT HEAD WITHOUT CONTRAST CT CERVICAL SPINE WITHOUT CONTRAST TECHNIQUE: Multidetector CT imaging of the head and cervical spine was performed following the standard protocol without intravenous contrast. Multiplanar CT image reconstructions of the cervical spine were also generated. COMPARISON:  May 26, 2018 FINDINGS: CT HEAD FINDINGS Brain: No subdural, epidural, or subarachnoid hemorrhage. Ventricles and sulci are prominent but stable. Cerebellum, brainstem, and basal cisterns are normal. White matter changes identified. No acute cortical ischemia or infarct noted. No mass effect or midline shift. Vascular: No hyperdense vessel or unexpected calcification. Skull: Normal. Negative for fracture or focal lesion. Sinuses/Orbits: No acute finding. Other: None. CT CERVICAL SPINE FINDINGS Alignment: Normal. Skull base and vertebrae: No acute fracture. No primary bone lesion or focal pathologic process. Soft tissues and spinal canal: No prevertebral fluid or swelling. No visible canal hematoma. Disc levels:  Multilevel degenerative disc disease. Upper chest: Negative. Other: No other abnormalities. IMPRESSION: 1. No acute intracranial abnormalities. 2. No fracture or traumatic malalignment in the  cervical spine. Degenerative changes. Electronically Signed   By: Gerome Samavid  Williams III M.D   On: 07/08/2018 18:07   Ct Cervical Spine Wo Contrast  Result Date: 07/08/2018 CLINICAL DATA:  Fall.  Ataxia. EXAM: CT HEAD WITHOUT CONTRAST CT CERVICAL SPINE  WITHOUT CONTRAST TECHNIQUE: Multidetector CT imaging of the head and cervical spine was performed following the standard protocol without intravenous contrast. Multiplanar CT image reconstructions of the cervical spine were also generated. COMPARISON:  May 26, 2018 FINDINGS: CT HEAD FINDINGS Brain: No subdural, epidural, or subarachnoid hemorrhage. Ventricles and sulci are prominent but stable. Cerebellum, brainstem, and basal cisterns are normal. White matter changes identified. No acute cortical ischemia or infarct noted. No mass effect or midline shift. Vascular: No hyperdense vessel or unexpected calcification. Skull: Normal. Negative for fracture or focal lesion. Sinuses/Orbits: No acute finding. Other: None. CT CERVICAL SPINE FINDINGS Alignment: Normal. Skull base and vertebrae: No acute fracture. No primary bone lesion or focal pathologic process. Soft tissues and spinal canal: No prevertebral fluid or swelling. No visible canal hematoma. Disc levels:  Multilevel degenerative disc disease. Upper chest: Negative. Other: No other abnormalities. IMPRESSION: 1. No acute intracranial abnormalities. 2. No fracture or traumatic malalignment in the cervical spine. Degenerative changes. Electronically Signed   By: Gerome Samavid  Williams III M.D   On: 07/08/2018 18:07   Mr Brain Wo Contrast  Result Date: 07/09/2018 CLINICAL DATA:  84 year old female with increasing confusion. Fall yesterday with leg injury. EXAM: MRI HEAD WITHOUT CONTRAST TECHNIQUE: Multiplanar, multiecho pulse sequences of the brain and surrounding structures were obtained without intravenous contrast. COMPARISON:  Head and cervical spine CT 07/08/2018, 05/26/2018. FINDINGS: Brain: No restricted diffusion to suggest acute infarction. No midline shift, mass effect, evidence of mass lesion, ventriculomegaly, extra-axial collection or acute intracranial hemorrhage. Cervicomedullary junction and pituitary are within normal limits. Patchy and scattered  cerebral white matter T2 and FLAIR hyperintensity, mild to moderate for age. No cortical encephalomalacia or chronic. Cerebral blood products identified the deep gray matter nuclei, brainstem, and cerebellum are normal for age. Vascular: Major intracranial vascular flow voids are preserved. Skull and upper cervical spine: Mild for age cervical spine degeneration. Normal bone marrow signal. Sinuses/Orbits: Postoperative changes to both globes, otherwise negative orbits. Paranasal sinuses and mastoids are stable and well pneumatized. Other: Visible internal auditory structures appear normal. Scalp and face soft tissues appear negative. IMPRESSION: 1.  No acute intracranial abnormality. 2. Mild to moderate for age signal changes in the cerebral white matter, most commonly due to chronic small vessel disease. Electronically Signed   By: Odessa FlemingH  Hall M.D.   On: 07/09/2018 06:38   Dg Chest Port 1 View  Result Date: 07/08/2018 CLINICAL DATA:  Patient status post fall today. EXAM: PORTABLE CHEST 1 VIEW COMPARISON:  None. FINDINGS: There is a small left pleural effusion and mild left basilar atelectasis. The right lung is clear. No right effusion. No pneumothorax on the right or left. Heart size is upper normal. Aortic atherosclerosis noted. No acute or focal bony abnormality. IMPRESSION: Small left pleural effusion and mild basilar airspace disease which is likely atelectasis rather than pneumonia. Atherosclerosis. Electronically Signed   By: Drusilla Kannerhomas  Dalessio M.D.   On: 07/08/2018 16:23   Vas [place] Lower Extremity Venous (dvt)  Result Date: 07/09/2018  Lower Venous Study Indications: Edema.  Performing Technologist: Blanch MediaMegan Riddle RVS  Examination Guidelines: A complete evaluation includes B-mode imaging, spectral Doppler, color Doppler, and power Doppler as needed of all accessible portions of each vessel. Bilateral testing is  considered an integral part of a complete examination. Limited examinations for reoccurring  indications may be performed as noted.  Right Venous Findings: +---------+---------------+---------+-----------+----------+--------------+          CompressibilityPhasicitySpontaneityPropertiesSummary        +---------+---------------+---------+-----------+----------+--------------+ CFV      Full           Yes      Yes                                 +---------+---------------+---------+-----------+----------+--------------+ SFJ      Full                                                        +---------+---------------+---------+-----------+----------+--------------+ FV Prox  Full                                                        +---------+---------------+---------+-----------+----------+--------------+ FV Mid   Full                                                        +---------+---------------+---------+-----------+----------+--------------+ FV DistalFull                                                        +---------+---------------+---------+-----------+----------+--------------+ PFV      Full                                                        +---------+---------------+---------+-----------+----------+--------------+ POP      Full           Yes      Yes                                 +---------+---------------+---------+-----------+----------+--------------+ PTV      Full                                                        +---------+---------------+---------+-----------+----------+--------------+ PERO                                                  not visualized +---------+---------------+---------+-----------+----------+--------------+  Left Venous Findings: +---------+---------------+---------+-----------+----------+--------------+          CompressibilityPhasicitySpontaneityPropertiesSummary        +---------+---------------+---------+-----------+----------+--------------+  CFV      Full           Yes       Yes                                 +---------+---------------+---------+-----------+----------+--------------+ SFJ      Full                                                        +---------+---------------+---------+-----------+----------+--------------+ FV Prox  Full                                                        +---------+---------------+---------+-----------+----------+--------------+ FV Mid   Full                                                        +---------+---------------+---------+-----------+----------+--------------+ FV DistalFull                                                        +---------+---------------+---------+-----------+----------+--------------+ PFV      Full                                                        +---------+---------------+---------+-----------+----------+--------------+ POP      Full           Yes      Yes                                 +---------+---------------+---------+-----------+----------+--------------+ PTV      Full                                                        +---------+---------------+---------+-----------+----------+--------------+ PERO                                                  not visualized +---------+---------------+---------+-----------+----------+--------------+    Summary: Right: There is no evidence of deep vein thrombosis in the lower extremity. No cystic structure found in the popliteal fossa. Left: There is no evidence of deep vein thrombosis in the lower extremity. No cystic structure found in the popliteal fossa.  *See table(s) above for measurements and observations. Electronically signed by Fabienne Bruns MD on 07/09/2018 at 5:38:43  PM.    Final       LAB RESULTS: Basic Metabolic Panel: Recent Labs  Lab 07/08/18 2356  07/11/18 0440 07/12/18 0300  NA  --    < > 139 137  K  --    < > 3.5 4.0  CL  --    < > 106 102  CO2  --    < > 24 23  GLUCOSE   --    < > 143* 145*  BUN  --    < > 10 7*  CREATININE  --    < > 0.89 0.81  CALCIUM  --    < > 7.7* 7.9*  MG 1.7  --   --   --    < > = values in this interval not displayed.   Liver Function Tests: Recent Labs  Lab 07/08/18 1627  AST 112*  ALT 51*  ALKPHOS 153*  BILITOT 1.0  PROT 5.3*  ALBUMIN 2.5*   No results for input(s): LIPASE, AMYLASE in the last 168 hours. No results for input(s): AMMONIA in the last 168 hours. CBC: Recent Labs  Lab 07/11/18 0440 07/12/18 0300  WBC 6.8 7.4  NEUTROABS 4.8 5.0  HGB 8.9* 9.3*  HCT 28.8* 30.3*  MCV 84.2 84.2  PLT 305 364   Cardiac Enzymes: Recent Labs  Lab 07/09/18 0704 07/09/18 1156  07/11/18 0440 07/12/18 0300  CKTOTAL 2,602*  --    < > 448* 273*  TROPONINI 1.14* 0.96*  --   --   --    < > = values in this interval not displayed.   BNP: Invalid input(s): POCBNP CBG: Recent Labs  Lab 07/11/18 1636 07/11/18 2147  GLUCAP 229* 165*      Disposition and Follow-up: Discharge Instructions    Diet - low sodium heart healthy   Complete by:  As directed    Increase activity slowly   Complete by:  As directed        DISPOSITION: Skilled nursing facility   DISCHARGE FOLLOW-UP Follow-up Information    Jarome Matin, MD. Schedule an appointment as soon as possible for a visit in 2 week(s).   Specialty:  Internal Medicine Contact information: [address] [place] [address] [phone]        Chrystie Nose, MD .   Specialty:  Cardiology Contact information: [address] [place] [address] [phone]            Time coordinating discharge:  35 minutes  Signed:   Thad Ranger M.D. [facility] 07/12/2018, 12:29 PM Pager: [phone]

## 2018-07-12 NOTE — Progress Notes (Signed)
PT Progress Note for Charges    07/12/18 1200  PT General Charges  $$ ACUTE PT VISIT 1 Visit  PT Treatments  $Therapeutic Activity 23-37 mins  Deborah ChalkJennifer Analuisa Tudor, [place] CarolinaPT, DPT  Acute Rehabilitation Services Pager [phone] Office [phone]

## 2018-07-12 NOTE — Care Management Important Message (Signed)
Important Message  Patient Details  Name: Patricia Lynch MRN: [id number] Date of Birth: [birth year]   Medicare Important Message Given:  Yes    Braydyn Schultes 07/12/2018, 2:25 PM

## 2018-07-12 NOTE — Plan of Care (Signed)
Patient stable, discussed POC with patient, agreeable with plan, denies question/concerns at this time.  

## 2018-07-12 NOTE — Clinical Social Work Placement (Signed)
Nurse to call report to [phone], Room 401P   Transport set for 4:00 PM.      CLINICAL SOCIAL WORK PLACEMENT  NOTE  Date:  07/12/2018  Patient Details  Name: Patricia Lynch MRN: [id number] Date of Birth: [birth year]  Clinical Social Work is seeking post-discharge placement for this patient at the Skilled  Nursing Facility level of care (*CSW will initial, date and re-position this form in  chart as items are completed):  Yes   Patient/family provided with [facility] Clinical Social Work Department's list of facilities offering this level of care within the geographic area requested by the patient (or if unable, by the patient's family).  Yes   Patient/family informed of their freedom to choose among providers that offer the needed level of care, that participate in Medicare, Medicaid or managed care program needed by the patient, have an available bed and are willing to accept the patient.  Yes   Patient/family informed of [facility]'s ownership interest in [facility] and [facility], as well as of the fact that they are under no obligation to receive care at these facilities.  PASRR submitted to EDS on       PASRR number received on 07/11/18     Existing PASRR number confirmed on       FL2 transmitted to all facilities in geographic area requested by pt/family on 07/11/18     FL2 transmitted to all facilities within larger geographic area on       Patient informed that his/her managed care company has contracts with or will negotiate with certain facilities, including the following:        Yes   Patient/family informed of bed offers received.  [facility]     Physician recommends and patient chooses bed at      [facility]  Patient to be transferred to facility by [facility]     Patient family notified on 07/12/18 of transfer.  Name of family member notified:  Daughter     PHYSICIAN        Additional Comment:    _______________________________________________ Baldemar LenisElizabeth M Sandra Brents, LCSW 07/12/2018, 1:59 PM

## 2018-07-13 DIAGNOSIS — G9340 Encephalopathy, unspecified: Secondary | ICD-10-CM | POA: Diagnosis not present

## 2018-07-13 DIAGNOSIS — T796XXD Traumatic ischemia of muscle, subsequent encounter: Secondary | ICD-10-CM | POA: Diagnosis not present

## 2018-07-13 DIAGNOSIS — I10 Essential (primary) hypertension: Secondary | ICD-10-CM | POA: Diagnosis not present

## 2018-07-13 DIAGNOSIS — N3000 Acute cystitis without hematuria: Secondary | ICD-10-CM | POA: Diagnosis not present

## 2018-07-14 LAB — CULTURE, BLOOD (ROUTINE X 2)
Culture: NO GROWTH
Culture: NO GROWTH
Special Requests: ADEQUATE

## 2018-07-17 DIAGNOSIS — L89153 Pressure ulcer of sacral region, stage 3: Secondary | ICD-10-CM | POA: Diagnosis not present

## 2018-07-17 DIAGNOSIS — S81811D Laceration without foreign body, right lower leg, subsequent encounter: Secondary | ICD-10-CM | POA: Diagnosis not present

## 2018-07-17 DIAGNOSIS — I214 Non-ST elevation (NSTEMI) myocardial infarction: Secondary | ICD-10-CM | POA: Diagnosis not present

## 2018-07-17 DIAGNOSIS — I10 Essential (primary) hypertension: Secondary | ICD-10-CM | POA: Diagnosis not present

## 2018-07-18 DIAGNOSIS — S81811D Laceration without foreign body, right lower leg, subsequent encounter: Secondary | ICD-10-CM | POA: Diagnosis not present

## 2018-07-18 DIAGNOSIS — I214 Non-ST elevation (NSTEMI) myocardial infarction: Secondary | ICD-10-CM | POA: Diagnosis not present

## 2018-07-18 DIAGNOSIS — I10 Essential (primary) hypertension: Secondary | ICD-10-CM | POA: Diagnosis not present

## 2018-07-23 DIAGNOSIS — R262 Difficulty in walking, not elsewhere classified: Secondary | ICD-10-CM | POA: Diagnosis not present

## 2018-07-23 DIAGNOSIS — M6281 Muscle weakness (generalized): Secondary | ICD-10-CM | POA: Diagnosis not present

## 2018-08-06 DIAGNOSIS — T148XXD Other injury of unspecified body region, subsequent encounter: Secondary | ICD-10-CM | POA: Diagnosis not present

## 2018-08-07 DIAGNOSIS — M6281 Muscle weakness (generalized): Secondary | ICD-10-CM | POA: Diagnosis not present

## 2018-08-07 DIAGNOSIS — S81811D Laceration without foreign body, right lower leg, subsequent encounter: Secondary | ICD-10-CM | POA: Diagnosis not present

## 2018-08-07 DIAGNOSIS — I10 Essential (primary) hypertension: Secondary | ICD-10-CM | POA: Diagnosis not present

## 2018-08-07 DIAGNOSIS — T148XXD Other injury of unspecified body region, subsequent encounter: Secondary | ICD-10-CM | POA: Diagnosis not present

## 2018-08-10 ENCOUNTER — Emergency Department (HOSPITAL_COMMUNITY)
Admission: EM | Admit: 2018-08-10 | Discharge: 2018-08-10 | Disposition: A | Discharge status: Home / Self Care | Payer: Medicare Other | Attending: Emergency Medicine | Admitting: Emergency Medicine

## 2018-08-10 ENCOUNTER — Encounter (HOSPITAL_COMMUNITY): Payer: Self-pay | Admitting: *Deleted

## 2018-08-10 DIAGNOSIS — I129 Hypertensive chronic kidney disease with stage 1 through stage 4 chronic kidney disease, or unspecified chronic kidney disease: Secondary | ICD-10-CM | POA: Diagnosis not present

## 2018-08-10 DIAGNOSIS — Z79899 Other long term (current) drug therapy: Secondary | ICD-10-CM | POA: Insufficient documentation

## 2018-08-10 DIAGNOSIS — Y939 Activity, unspecified: Secondary | ICD-10-CM | POA: Insufficient documentation

## 2018-08-10 DIAGNOSIS — S81801A Unspecified open wound, right lower leg, initial encounter: Secondary | ICD-10-CM | POA: Diagnosis not present

## 2018-08-10 DIAGNOSIS — Y929 Unspecified place or not applicable: Secondary | ICD-10-CM | POA: Insufficient documentation

## 2018-08-10 DIAGNOSIS — T148XXD Other injury of unspecified body region, subsequent encounter: Secondary | ICD-10-CM | POA: Diagnosis not present

## 2018-08-10 DIAGNOSIS — Y999 Unspecified external cause status: Secondary | ICD-10-CM | POA: Insufficient documentation

## 2018-08-10 DIAGNOSIS — N189 Chronic kidney disease, unspecified: Secondary | ICD-10-CM | POA: Diagnosis not present

## 2018-08-10 DIAGNOSIS — Y33XXXA Other specified events, undetermined intent, initial encounter: Secondary | ICD-10-CM | POA: Insufficient documentation

## 2018-08-10 DIAGNOSIS — F0390 Unspecified dementia without behavioral disturbance: Secondary | ICD-10-CM | POA: Diagnosis not present

## 2018-08-10 NOTE — Discharge Instructions (Addendum)
Strongly recommend follow-up with the [facility] long wound care center.  Phone number given.  Will need to call for an appointment.  I do not see any obvious infection in the wound.  Dressing changes as per your protocol at the facility.  We use Xeroform/Xeroflo today.

## 2018-08-10 NOTE — ED Provider Notes (Signed)
[facility]-EMERGENCY DEPT Provider Note   CSN: [id number] Arrival date & time: 08/10/18  1018     History   Chief Complaint Chief Complaint  Patient presents with  . Wound Check    HPI Patricia Lynch is a 85 y.o. female.  Level 5 caveat for dementia.  Patient presents with her daughter with concerns about a wound on her right lower extremity.  She has recently had a wound VAC in this area, but it was taken off today.  No fever, sweats, chills, pus in wound.  Severity is moderate.     Past Medical History:  Diagnosis Date  . Dementia (HCC)   . Hyperlipidemia   . Hypertension     Patient Active Problem List   Diagnosis Date Noted  . Pressure injury of skin 07/10/2018  . Goals of care, counseling/discussion   . Palliative care by specialist   . Dementia (HCC)   . Acute encephalopathy 07/08/2018  . Troponin level elevated 07/08/2018  . Hypothermia 07/08/2018  . Acute lower UTI 07/08/2018  . Non-traumatic rhabdomyolysis 05/26/2018  . Essential hypertension 05/26/2018  . Hyperlipidemia 05/26/2018  . Mild cognitive impairment 05/26/2018  . Renal insufficiency 05/26/2018  . Right arm pain 05/26/2018    History reviewed. No pertinent surgical history.   OB History   No obstetric history on file.      Home Medications    Prior to Admission medications   Medication Sig Start Date End Date Taking? Authorizing Provider  acetaminophen (TYLENOL) 325 MG tablet Take 650 mg by mouth 3 (three) times daily.   Yes [provider]  acetaminophen (TYLENOL) 500 MG tablet Take 500 mg by mouth every 6 (six) hours as needed for moderate pain.    Yes [provider]  Amino Acids-Protein Hydrolys (FEEDING SUPPLEMENT, PRO-STAT SUGAR FREE 64,) LIQD Take 30 mLs by mouth daily.   Yes [provider]  collagenase (SANTYL) ointment Apply 1 application topically daily.   Yes [provider]  losartan (COZAAR) 50 MG tablet Take 50 mg by  mouth at bedtime. 04/14/18  Yes [provider]  Menthol, Topical Analgesic, (BIOFREEZE EX) Apply 1 application topically daily as needed (back pain).   Yes [provider]  mupirocin ointment (BACTROBAN) 2 % Apply topically daily. Cleanse wounds to left anterior lower leg, left malleolus and left hip with NS and pat dry. Apply mupirocin ointment and cover with dry dressing daily. Patient not taking: Reported on 08/10/2018 07/12/18   Cathren Harshai, Ripudeep K, MD    Family History Family History  Family history unknown: Yes    Social History Social History   Tobacco Use  . Smoking status: Never Smoker  . Smokeless tobacco: Never Used  Substance Use Topics  . Alcohol use: Not on file  . Drug use: Not on file     Allergies   Beef-derived products and Other   Review of Systems Review of Systems  Unable to perform ROS: Dementia     Physical Exam Updated Vital Signs BP (!) 172/73 (BP Location: Left Arm)   Pulse 68   Temp 97.6 F (36.4 C) (Oral)   Resp 18   SpO2 98%   Physical Exam Vitals signs and nursing note reviewed.  Constitutional:      Appearance: She is well-developed.     Comments: Pleasant, demented  HENT:     Head: Normocephalic and atraumatic.  Eyes:     Conjunctiva/sclera: Conjunctivae normal.  Neck:     Musculoskeletal:  Neck supple.  Cardiovascular:     Rate and Rhythm: Normal rate and regular rhythm.  Pulmonary:     Effort: Pulmonary effort is normal.     Breath sounds: Normal breath sounds.  Abdominal:     General: Bowel sounds are normal.     Palpations: Abdomen is soft.  Musculoskeletal: Normal range of motion.  Skin:    Comments: Right lower extremity: Deep wound 3 x 2 cm's on anterior aspect of tibia to the bone.  No obvious pus coming from wound.  Good granulation tissue.  Additionally, there is a satellite lesion on the lateral aspect of the mid tibia that appears to be healing appropriately.  Neurological:     Comments: Moving all  extremities  Psychiatric:     Comments: Flat affect      ED Treatments / Results  Labs (all labs ordered are listed, but only abnormal results are displayed) Labs Reviewed - No data to display  EKG None  Radiology No results found.  Procedures Procedures (including critical care time)  Medications Ordered in ED Medications - No data to display   Initial Impression / Assessment and Plan / ED Course  I have reviewed the triage vital signs and the nursing notes.  Pertinent labs & imaging results that were available during my care of the patient were reviewed by me and considered in my medical decision making (see chart for details).     Right lower extremity wound examined.  No obvious purulence from wound.  Patient is nonseptic appearing.  Dressing change in ED.  Will refer to wound care center for further management.  Final Clinical Impressions(s) / ED Diagnoses   Final diagnoses:  Wound of right lower extremity, initial encounter    ED Discharge Orders    None       Donnetta Hutching, MD 08/10/18 1303

## 2018-08-10 NOTE — ED Triage Notes (Signed)
Per EMS, pt from [facility] has pressure wound to lower right leg. Pt had wound vac taken off this morning and had a lot of drainage. Nursing facility MD wanted pt's wound evaluated. Pt also has sacral wound.  BP 150/76 HR 72 RR 18 SpO2 96% on RA

## 2018-08-13 DIAGNOSIS — L89152 Pressure ulcer of sacral region, stage 2: Secondary | ICD-10-CM | POA: Diagnosis not present

## 2018-08-13 DIAGNOSIS — I10 Essential (primary) hypertension: Secondary | ICD-10-CM | POA: Diagnosis not present

## 2018-08-13 DIAGNOSIS — S81811D Laceration without foreign body, right lower leg, subsequent encounter: Secondary | ICD-10-CM | POA: Diagnosis not present

## 2018-08-13 DIAGNOSIS — M6281 Muscle weakness (generalized): Secondary | ICD-10-CM | POA: Diagnosis not present

## 2018-08-14 DIAGNOSIS — L22 Diaper dermatitis: Secondary | ICD-10-CM | POA: Diagnosis not present

## 2018-08-14 DIAGNOSIS — L97813 Non-pressure chronic ulcer of other part of right lower leg with necrosis of muscle: Secondary | ICD-10-CM | POA: Diagnosis not present

## 2018-08-14 DIAGNOSIS — L97822 Non-pressure chronic ulcer of other part of left lower leg with fat layer exposed: Secondary | ICD-10-CM | POA: Diagnosis not present

## 2018-08-17 DIAGNOSIS — I10 Essential (primary) hypertension: Secondary | ICD-10-CM | POA: Diagnosis not present

## 2018-08-17 DIAGNOSIS — R296 Repeated falls: Secondary | ICD-10-CM | POA: Diagnosis not present

## 2018-08-17 DIAGNOSIS — S81811D Laceration without foreign body, right lower leg, subsequent encounter: Secondary | ICD-10-CM | POA: Diagnosis not present

## 2018-08-21 DIAGNOSIS — I10 Essential (primary) hypertension: Secondary | ICD-10-CM | POA: Diagnosis not present

## 2018-08-21 DIAGNOSIS — N898 Other specified noninflammatory disorders of vagina: Secondary | ICD-10-CM | POA: Diagnosis not present

## 2018-09-04 DIAGNOSIS — R4189 Other symptoms and signs involving cognitive functions and awareness: Secondary | ICD-10-CM | POA: Diagnosis not present

## 2018-09-04 DIAGNOSIS — I5189 Other ill-defined heart diseases: Secondary | ICD-10-CM | POA: Diagnosis not present

## 2018-09-04 DIAGNOSIS — L98429 Non-pressure chronic ulcer of back with unspecified severity: Secondary | ICD-10-CM | POA: Diagnosis not present

## 2018-09-04 DIAGNOSIS — E440 Moderate protein-calorie malnutrition: Secondary | ICD-10-CM | POA: Diagnosis not present

## 2018-09-11 DIAGNOSIS — L97813 Non-pressure chronic ulcer of other part of right lower leg with necrosis of muscle: Secondary | ICD-10-CM | POA: Diagnosis not present

## 2018-09-11 DIAGNOSIS — L89153 Pressure ulcer of sacral region, stage 3: Secondary | ICD-10-CM | POA: Diagnosis not present

## 2018-09-11 DIAGNOSIS — L97812 Non-pressure chronic ulcer of other part of right lower leg with fat layer exposed: Secondary | ICD-10-CM | POA: Diagnosis not present

## 2018-09-18 DIAGNOSIS — R4189 Other symptoms and signs involving cognitive functions and awareness: Secondary | ICD-10-CM | POA: Diagnosis not present

## 2018-09-18 DIAGNOSIS — L710 Perioral dermatitis: Secondary | ICD-10-CM | POA: Diagnosis not present

## 2018-09-25 DIAGNOSIS — R278 Other lack of coordination: Secondary | ICD-10-CM | POA: Diagnosis not present

## 2018-09-25 DIAGNOSIS — R2681 Unsteadiness on feet: Secondary | ICD-10-CM | POA: Diagnosis not present

## 2018-09-25 DIAGNOSIS — R41841 Cognitive communication deficit: Secondary | ICD-10-CM | POA: Diagnosis not present

## 2018-09-25 DIAGNOSIS — Z9181 History of falling: Secondary | ICD-10-CM | POA: Diagnosis not present

## 2018-09-25 DIAGNOSIS — M6281 Muscle weakness (generalized): Secondary | ICD-10-CM | POA: Diagnosis not present

## 2018-09-25 DIAGNOSIS — R2689 Other abnormalities of gait and mobility: Secondary | ICD-10-CM | POA: Diagnosis not present

## 2018-09-25 DIAGNOSIS — G9340 Encephalopathy, unspecified: Secondary | ICD-10-CM | POA: Diagnosis not present

## 2018-09-26 DIAGNOSIS — G9340 Encephalopathy, unspecified: Secondary | ICD-10-CM | POA: Diagnosis not present

## 2018-09-26 DIAGNOSIS — R278 Other lack of coordination: Secondary | ICD-10-CM | POA: Diagnosis not present

## 2018-09-26 DIAGNOSIS — L89153 Pressure ulcer of sacral region, stage 3: Secondary | ICD-10-CM | POA: Diagnosis not present

## 2018-09-26 DIAGNOSIS — L97813 Non-pressure chronic ulcer of other part of right lower leg with necrosis of muscle: Secondary | ICD-10-CM | POA: Diagnosis not present

## 2018-09-26 DIAGNOSIS — R2681 Unsteadiness on feet: Secondary | ICD-10-CM | POA: Diagnosis not present

## 2018-09-26 DIAGNOSIS — L97812 Non-pressure chronic ulcer of other part of right lower leg with fat layer exposed: Secondary | ICD-10-CM | POA: Diagnosis not present

## 2018-09-26 DIAGNOSIS — M6281 Muscle weakness (generalized): Secondary | ICD-10-CM | POA: Diagnosis not present

## 2018-09-26 DIAGNOSIS — R2689 Other abnormalities of gait and mobility: Secondary | ICD-10-CM | POA: Diagnosis not present

## 2018-09-26 DIAGNOSIS — R41841 Cognitive communication deficit: Secondary | ICD-10-CM | POA: Diagnosis not present

## 2018-09-27 DIAGNOSIS — R2689 Other abnormalities of gait and mobility: Secondary | ICD-10-CM | POA: Diagnosis not present

## 2018-09-27 DIAGNOSIS — R2681 Unsteadiness on feet: Secondary | ICD-10-CM | POA: Diagnosis not present

## 2018-09-27 DIAGNOSIS — G9340 Encephalopathy, unspecified: Secondary | ICD-10-CM | POA: Diagnosis not present

## 2018-09-27 DIAGNOSIS — R41841 Cognitive communication deficit: Secondary | ICD-10-CM | POA: Diagnosis not present

## 2018-09-27 DIAGNOSIS — M6281 Muscle weakness (generalized): Secondary | ICD-10-CM | POA: Diagnosis not present

## 2018-09-27 DIAGNOSIS — R278 Other lack of coordination: Secondary | ICD-10-CM | POA: Diagnosis not present

## 2018-09-28 DIAGNOSIS — R278 Other lack of coordination: Secondary | ICD-10-CM | POA: Diagnosis not present

## 2018-09-28 DIAGNOSIS — G9340 Encephalopathy, unspecified: Secondary | ICD-10-CM | POA: Diagnosis not present

## 2018-09-28 DIAGNOSIS — R2681 Unsteadiness on feet: Secondary | ICD-10-CM | POA: Diagnosis not present

## 2018-09-28 DIAGNOSIS — R41841 Cognitive communication deficit: Secondary | ICD-10-CM | POA: Diagnosis not present

## 2018-09-28 DIAGNOSIS — R2689 Other abnormalities of gait and mobility: Secondary | ICD-10-CM | POA: Diagnosis not present

## 2018-09-28 DIAGNOSIS — M6281 Muscle weakness (generalized): Secondary | ICD-10-CM | POA: Diagnosis not present

## 2018-10-01 DIAGNOSIS — R41841 Cognitive communication deficit: Secondary | ICD-10-CM | POA: Diagnosis not present

## 2018-10-01 DIAGNOSIS — R2681 Unsteadiness on feet: Secondary | ICD-10-CM | POA: Diagnosis not present

## 2018-10-01 DIAGNOSIS — M6281 Muscle weakness (generalized): Secondary | ICD-10-CM | POA: Diagnosis not present

## 2018-10-01 DIAGNOSIS — R278 Other lack of coordination: Secondary | ICD-10-CM | POA: Diagnosis not present

## 2018-10-01 DIAGNOSIS — G9340 Encephalopathy, unspecified: Secondary | ICD-10-CM | POA: Diagnosis not present

## 2018-10-01 DIAGNOSIS — R2689 Other abnormalities of gait and mobility: Secondary | ICD-10-CM | POA: Diagnosis not present

## 2018-10-02 DIAGNOSIS — G9340 Encephalopathy, unspecified: Secondary | ICD-10-CM | POA: Diagnosis not present

## 2018-10-02 DIAGNOSIS — R2681 Unsteadiness on feet: Secondary | ICD-10-CM | POA: Diagnosis not present

## 2018-10-02 DIAGNOSIS — R41841 Cognitive communication deficit: Secondary | ICD-10-CM | POA: Diagnosis not present

## 2018-10-02 DIAGNOSIS — I10 Essential (primary) hypertension: Secondary | ICD-10-CM | POA: Diagnosis not present

## 2018-10-02 DIAGNOSIS — E7849 Other hyperlipidemia: Secondary | ICD-10-CM | POA: Diagnosis not present

## 2018-10-02 DIAGNOSIS — D6489 Other specified anemias: Secondary | ICD-10-CM | POA: Diagnosis not present

## 2018-10-02 DIAGNOSIS — R2689 Other abnormalities of gait and mobility: Secondary | ICD-10-CM | POA: Diagnosis not present

## 2018-10-02 DIAGNOSIS — R634 Abnormal weight loss: Secondary | ICD-10-CM | POA: Diagnosis not present

## 2018-10-02 DIAGNOSIS — R4189 Other symptoms and signs involving cognitive functions and awareness: Secondary | ICD-10-CM | POA: Diagnosis not present

## 2018-10-02 DIAGNOSIS — M6281 Muscle weakness (generalized): Secondary | ICD-10-CM | POA: Diagnosis not present

## 2018-10-02 DIAGNOSIS — R278 Other lack of coordination: Secondary | ICD-10-CM | POA: Diagnosis not present

## 2018-10-02 DIAGNOSIS — I5189 Other ill-defined heart diseases: Secondary | ICD-10-CM | POA: Diagnosis not present

## 2018-10-03 DIAGNOSIS — M6281 Muscle weakness (generalized): Secondary | ICD-10-CM | POA: Diagnosis not present

## 2018-10-03 DIAGNOSIS — R2681 Unsteadiness on feet: Secondary | ICD-10-CM | POA: Diagnosis not present

## 2018-10-03 DIAGNOSIS — R278 Other lack of coordination: Secondary | ICD-10-CM | POA: Diagnosis not present

## 2018-10-03 DIAGNOSIS — R41841 Cognitive communication deficit: Secondary | ICD-10-CM | POA: Diagnosis not present

## 2018-10-03 DIAGNOSIS — R2689 Other abnormalities of gait and mobility: Secondary | ICD-10-CM | POA: Diagnosis not present

## 2018-10-03 DIAGNOSIS — L97819 Non-pressure chronic ulcer of other part of right lower leg with unspecified severity: Secondary | ICD-10-CM | POA: Diagnosis not present

## 2018-10-03 DIAGNOSIS — L97813 Non-pressure chronic ulcer of other part of right lower leg with necrosis of muscle: Secondary | ICD-10-CM | POA: Diagnosis not present

## 2018-10-03 DIAGNOSIS — G9340 Encephalopathy, unspecified: Secondary | ICD-10-CM | POA: Diagnosis not present

## 2018-10-03 DIAGNOSIS — L89153 Pressure ulcer of sacral region, stage 3: Secondary | ICD-10-CM | POA: Diagnosis not present

## 2018-10-04 DIAGNOSIS — R278 Other lack of coordination: Secondary | ICD-10-CM | POA: Diagnosis not present

## 2018-10-04 DIAGNOSIS — G9340 Encephalopathy, unspecified: Secondary | ICD-10-CM | POA: Diagnosis not present

## 2018-10-04 DIAGNOSIS — R41841 Cognitive communication deficit: Secondary | ICD-10-CM | POA: Diagnosis not present

## 2018-10-04 DIAGNOSIS — R2681 Unsteadiness on feet: Secondary | ICD-10-CM | POA: Diagnosis not present

## 2018-10-04 DIAGNOSIS — R2689 Other abnormalities of gait and mobility: Secondary | ICD-10-CM | POA: Diagnosis not present

## 2018-10-04 DIAGNOSIS — M6281 Muscle weakness (generalized): Secondary | ICD-10-CM | POA: Diagnosis not present

## 2018-10-05 DIAGNOSIS — R278 Other lack of coordination: Secondary | ICD-10-CM | POA: Diagnosis not present

## 2018-10-05 DIAGNOSIS — G9340 Encephalopathy, unspecified: Secondary | ICD-10-CM | POA: Diagnosis not present

## 2018-10-05 DIAGNOSIS — R2681 Unsteadiness on feet: Secondary | ICD-10-CM | POA: Diagnosis not present

## 2018-10-05 DIAGNOSIS — R41841 Cognitive communication deficit: Secondary | ICD-10-CM | POA: Diagnosis not present

## 2018-10-05 DIAGNOSIS — R2689 Other abnormalities of gait and mobility: Secondary | ICD-10-CM | POA: Diagnosis not present

## 2018-10-05 DIAGNOSIS — M6281 Muscle weakness (generalized): Secondary | ICD-10-CM | POA: Diagnosis not present

## 2018-10-08 DIAGNOSIS — R278 Other lack of coordination: Secondary | ICD-10-CM | POA: Diagnosis not present

## 2018-10-08 DIAGNOSIS — R2689 Other abnormalities of gait and mobility: Secondary | ICD-10-CM | POA: Diagnosis not present

## 2018-10-08 DIAGNOSIS — R41841 Cognitive communication deficit: Secondary | ICD-10-CM | POA: Diagnosis not present

## 2018-10-08 DIAGNOSIS — R2681 Unsteadiness on feet: Secondary | ICD-10-CM | POA: Diagnosis not present

## 2018-10-08 DIAGNOSIS — M6281 Muscle weakness (generalized): Secondary | ICD-10-CM | POA: Diagnosis not present

## 2018-10-08 DIAGNOSIS — G9340 Encephalopathy, unspecified: Secondary | ICD-10-CM | POA: Diagnosis not present

## 2018-10-09 DIAGNOSIS — R41841 Cognitive communication deficit: Secondary | ICD-10-CM | POA: Diagnosis not present

## 2018-10-09 DIAGNOSIS — M6281 Muscle weakness (generalized): Secondary | ICD-10-CM | POA: Diagnosis not present

## 2018-10-09 DIAGNOSIS — G9340 Encephalopathy, unspecified: Secondary | ICD-10-CM | POA: Diagnosis not present

## 2018-10-09 DIAGNOSIS — R2681 Unsteadiness on feet: Secondary | ICD-10-CM | POA: Diagnosis not present

## 2018-10-09 DIAGNOSIS — R278 Other lack of coordination: Secondary | ICD-10-CM | POA: Diagnosis not present

## 2018-10-09 DIAGNOSIS — R2689 Other abnormalities of gait and mobility: Secondary | ICD-10-CM | POA: Diagnosis not present

## 2018-10-10 DIAGNOSIS — L308 Other specified dermatitis: Secondary | ICD-10-CM | POA: Diagnosis not present

## 2018-10-10 DIAGNOSIS — M25562 Pain in left knee: Secondary | ICD-10-CM | POA: Diagnosis not present

## 2018-10-10 DIAGNOSIS — L89153 Pressure ulcer of sacral region, stage 3: Secondary | ICD-10-CM | POA: Diagnosis not present

## 2018-10-10 DIAGNOSIS — R2681 Unsteadiness on feet: Secondary | ICD-10-CM | POA: Diagnosis not present

## 2018-10-10 DIAGNOSIS — R278 Other lack of coordination: Secondary | ICD-10-CM | POA: Diagnosis not present

## 2018-10-10 DIAGNOSIS — M6281 Muscle weakness (generalized): Secondary | ICD-10-CM | POA: Diagnosis not present

## 2018-10-10 DIAGNOSIS — R2689 Other abnormalities of gait and mobility: Secondary | ICD-10-CM | POA: Diagnosis not present

## 2018-10-10 DIAGNOSIS — R41841 Cognitive communication deficit: Secondary | ICD-10-CM | POA: Diagnosis not present

## 2018-10-10 DIAGNOSIS — G9340 Encephalopathy, unspecified: Secondary | ICD-10-CM | POA: Diagnosis not present

## 2018-10-10 DIAGNOSIS — L97819 Non-pressure chronic ulcer of other part of right lower leg with unspecified severity: Secondary | ICD-10-CM | POA: Diagnosis not present

## 2018-10-10 DIAGNOSIS — L97813 Non-pressure chronic ulcer of other part of right lower leg with necrosis of muscle: Secondary | ICD-10-CM | POA: Diagnosis not present

## 2018-10-11 DIAGNOSIS — R41841 Cognitive communication deficit: Secondary | ICD-10-CM | POA: Diagnosis not present

## 2018-10-11 DIAGNOSIS — M25562 Pain in left knee: Secondary | ICD-10-CM | POA: Diagnosis not present

## 2018-10-11 DIAGNOSIS — R2689 Other abnormalities of gait and mobility: Secondary | ICD-10-CM | POA: Diagnosis not present

## 2018-10-11 DIAGNOSIS — R4189 Other symptoms and signs involving cognitive functions and awareness: Secondary | ICD-10-CM | POA: Diagnosis not present

## 2018-10-11 DIAGNOSIS — M6281 Muscle weakness (generalized): Secondary | ICD-10-CM | POA: Diagnosis not present

## 2018-10-11 DIAGNOSIS — R278 Other lack of coordination: Secondary | ICD-10-CM | POA: Diagnosis not present

## 2018-10-11 DIAGNOSIS — R2681 Unsteadiness on feet: Secondary | ICD-10-CM | POA: Diagnosis not present

## 2018-10-11 DIAGNOSIS — G9340 Encephalopathy, unspecified: Secondary | ICD-10-CM | POA: Diagnosis not present

## 2018-10-12 DIAGNOSIS — M6281 Muscle weakness (generalized): Secondary | ICD-10-CM | POA: Diagnosis not present

## 2018-10-12 DIAGNOSIS — G9340 Encephalopathy, unspecified: Secondary | ICD-10-CM | POA: Diagnosis not present

## 2018-10-12 DIAGNOSIS — R2681 Unsteadiness on feet: Secondary | ICD-10-CM | POA: Diagnosis not present

## 2018-10-12 DIAGNOSIS — R2689 Other abnormalities of gait and mobility: Secondary | ICD-10-CM | POA: Diagnosis not present

## 2018-10-12 DIAGNOSIS — R278 Other lack of coordination: Secondary | ICD-10-CM | POA: Diagnosis not present

## 2018-10-12 DIAGNOSIS — R41841 Cognitive communication deficit: Secondary | ICD-10-CM | POA: Diagnosis not present

## 2018-10-15 DIAGNOSIS — G9340 Encephalopathy, unspecified: Secondary | ICD-10-CM | POA: Diagnosis not present

## 2018-10-15 DIAGNOSIS — R2681 Unsteadiness on feet: Secondary | ICD-10-CM | POA: Diagnosis not present

## 2018-10-15 DIAGNOSIS — R41841 Cognitive communication deficit: Secondary | ICD-10-CM | POA: Diagnosis not present

## 2018-10-15 DIAGNOSIS — M6281 Muscle weakness (generalized): Secondary | ICD-10-CM | POA: Diagnosis not present

## 2018-10-15 DIAGNOSIS — R278 Other lack of coordination: Secondary | ICD-10-CM | POA: Diagnosis not present

## 2018-10-15 DIAGNOSIS — R2689 Other abnormalities of gait and mobility: Secondary | ICD-10-CM | POA: Diagnosis not present

## 2018-10-16 DIAGNOSIS — R278 Other lack of coordination: Secondary | ICD-10-CM | POA: Diagnosis not present

## 2018-10-16 DIAGNOSIS — R2689 Other abnormalities of gait and mobility: Secondary | ICD-10-CM | POA: Diagnosis not present

## 2018-10-16 DIAGNOSIS — R41841 Cognitive communication deficit: Secondary | ICD-10-CM | POA: Diagnosis not present

## 2018-10-16 DIAGNOSIS — M6281 Muscle weakness (generalized): Secondary | ICD-10-CM | POA: Diagnosis not present

## 2018-10-16 DIAGNOSIS — R2681 Unsteadiness on feet: Secondary | ICD-10-CM | POA: Diagnosis not present

## 2018-10-16 DIAGNOSIS — G9340 Encephalopathy, unspecified: Secondary | ICD-10-CM | POA: Diagnosis not present

## 2018-10-17 DIAGNOSIS — L89153 Pressure ulcer of sacral region, stage 3: Secondary | ICD-10-CM | POA: Diagnosis not present

## 2018-10-17 DIAGNOSIS — L97819 Non-pressure chronic ulcer of other part of right lower leg with unspecified severity: Secondary | ICD-10-CM | POA: Diagnosis not present

## 2018-10-17 DIAGNOSIS — L97813 Non-pressure chronic ulcer of other part of right lower leg with necrosis of muscle: Secondary | ICD-10-CM | POA: Diagnosis not present

## 2018-10-31 DIAGNOSIS — L97819 Non-pressure chronic ulcer of other part of right lower leg with unspecified severity: Secondary | ICD-10-CM | POA: Diagnosis not present

## 2018-11-07 DIAGNOSIS — L97819 Non-pressure chronic ulcer of other part of right lower leg with unspecified severity: Secondary | ICD-10-CM | POA: Diagnosis not present

## 2018-11-07 DIAGNOSIS — L97812 Non-pressure chronic ulcer of other part of right lower leg with fat layer exposed: Secondary | ICD-10-CM | POA: Diagnosis not present

## 2018-11-14 DIAGNOSIS — L97819 Non-pressure chronic ulcer of other part of right lower leg with unspecified severity: Secondary | ICD-10-CM | POA: Diagnosis not present

## 2018-12-04 DIAGNOSIS — F0280 Dementia in other diseases classified elsewhere without behavioral disturbance: Secondary | ICD-10-CM | POA: Diagnosis not present

## 2018-12-04 DIAGNOSIS — I5189 Other ill-defined heart diseases: Secondary | ICD-10-CM | POA: Diagnosis not present

## 2018-12-04 DIAGNOSIS — E440 Moderate protein-calorie malnutrition: Secondary | ICD-10-CM | POA: Diagnosis not present

## 2018-12-04 DIAGNOSIS — I214 Non-ST elevation (NSTEMI) myocardial infarction: Secondary | ICD-10-CM | POA: Diagnosis not present

## 2018-12-04 DIAGNOSIS — L308 Other specified dermatitis: Secondary | ICD-10-CM | POA: Diagnosis not present

## 2018-12-04 DIAGNOSIS — R2689 Other abnormalities of gait and mobility: Secondary | ICD-10-CM | POA: Diagnosis not present

## 2019-02-05 DIAGNOSIS — M7918 Myalgia, other site: Secondary | ICD-10-CM | POA: Diagnosis not present

## 2019-02-05 DIAGNOSIS — R269 Unspecified abnormalities of gait and mobility: Secondary | ICD-10-CM | POA: Diagnosis not present

## 2019-02-05 DIAGNOSIS — L309 Dermatitis, unspecified: Secondary | ICD-10-CM | POA: Diagnosis not present

## 2019-02-05 DIAGNOSIS — F0390 Unspecified dementia without behavioral disturbance: Secondary | ICD-10-CM | POA: Diagnosis not present

## 2019-04-04 DIAGNOSIS — I214 Non-ST elevation (NSTEMI) myocardial infarction: Secondary | ICD-10-CM | POA: Diagnosis not present

## 2019-04-04 DIAGNOSIS — I519 Heart disease, unspecified: Secondary | ICD-10-CM | POA: Diagnosis not present

## 2019-04-04 DIAGNOSIS — F0390 Unspecified dementia without behavioral disturbance: Secondary | ICD-10-CM | POA: Diagnosis not present

## 2019-04-04 DIAGNOSIS — I10 Essential (primary) hypertension: Secondary | ICD-10-CM | POA: Diagnosis not present

## 2019-05-14 DIAGNOSIS — U071 COVID-19: Secondary | ICD-10-CM | POA: Diagnosis not present

## 2019-05-21 DIAGNOSIS — U071 COVID-19: Secondary | ICD-10-CM | POA: Diagnosis not present

## 2019-05-23 DIAGNOSIS — I5189 Other ill-defined heart diseases: Secondary | ICD-10-CM | POA: Diagnosis not present

## 2019-05-23 DIAGNOSIS — I10 Essential (primary) hypertension: Secondary | ICD-10-CM | POA: Diagnosis not present

## 2019-05-23 DIAGNOSIS — F0280 Dementia in other diseases classified elsewhere without behavioral disturbance: Secondary | ICD-10-CM | POA: Diagnosis not present

## 2019-05-23 DIAGNOSIS — E7849 Other hyperlipidemia: Secondary | ICD-10-CM | POA: Diagnosis not present

## 2019-05-23 DIAGNOSIS — D6489 Other specified anemias: Secondary | ICD-10-CM | POA: Diagnosis not present

## 2019-06-04 DIAGNOSIS — F0280 Dementia in other diseases classified elsewhere without behavioral disturbance: Secondary | ICD-10-CM | POA: Diagnosis not present

## 2019-06-04 DIAGNOSIS — U071 COVID-19: Secondary | ICD-10-CM | POA: Diagnosis not present

## 2019-06-04 DIAGNOSIS — M7918 Myalgia, other site: Secondary | ICD-10-CM | POA: Diagnosis not present

## 2019-06-11 DIAGNOSIS — U071 COVID-19: Secondary | ICD-10-CM | POA: Diagnosis not present

## 2019-06-18 DIAGNOSIS — U071 COVID-19: Secondary | ICD-10-CM | POA: Diagnosis not present

## 2019-06-24 DIAGNOSIS — U071 COVID-19: Secondary | ICD-10-CM | POA: Diagnosis not present

## 2019-07-01 DIAGNOSIS — U071 COVID-19: Secondary | ICD-10-CM | POA: Diagnosis not present

## 2019-07-02 DIAGNOSIS — F0280 Dementia in other diseases classified elsewhere without behavioral disturbance: Secondary | ICD-10-CM | POA: Diagnosis not present

## 2019-07-02 DIAGNOSIS — R21 Rash and other nonspecific skin eruption: Secondary | ICD-10-CM | POA: Diagnosis not present

## 2019-07-02 DIAGNOSIS — I10 Essential (primary) hypertension: Secondary | ICD-10-CM | POA: Diagnosis not present

## 2019-07-11 DIAGNOSIS — F0280 Dementia in other diseases classified elsewhere without behavioral disturbance: Secondary | ICD-10-CM | POA: Diagnosis not present

## 2019-07-11 DIAGNOSIS — E440 Moderate protein-calorie malnutrition: Secondary | ICD-10-CM | POA: Diagnosis not present

## 2019-07-11 DIAGNOSIS — I214 Non-ST elevation (NSTEMI) myocardial infarction: Secondary | ICD-10-CM | POA: Diagnosis not present

## 2019-07-11 DIAGNOSIS — I119 Hypertensive heart disease without heart failure: Secondary | ICD-10-CM | POA: Diagnosis not present

## 2019-07-11 DIAGNOSIS — R2689 Other abnormalities of gait and mobility: Secondary | ICD-10-CM | POA: Diagnosis not present

## 2019-07-11 DIAGNOSIS — L308 Other specified dermatitis: Secondary | ICD-10-CM | POA: Diagnosis not present

## 2019-10-04 IMAGING — CT CT HEAD W/O CM
4 of 10 series · 15 of 47 positions shown, 18 images · non-contrast
Comparison: May 26, 2018

CLINICAL DATA: Fall.  Ataxia.

EXAM:
CT HEAD WITHOUT CONTRAST
CT CERVICAL SPINE WITHOUT CONTRAST
TECHNIQUE: Multidetector CT imaging of the head and cervical spine was
performed following the standard protocol without intravenous
contrast. Multiplanar CT image reconstructions of the cervical spine
were also generated.

[Series 5: head 5.0 h30s · axial · 0.39mm/px · z∈[+1272,+1327]mm · 2 of 33 slices shown]
[im 11/33  brain]
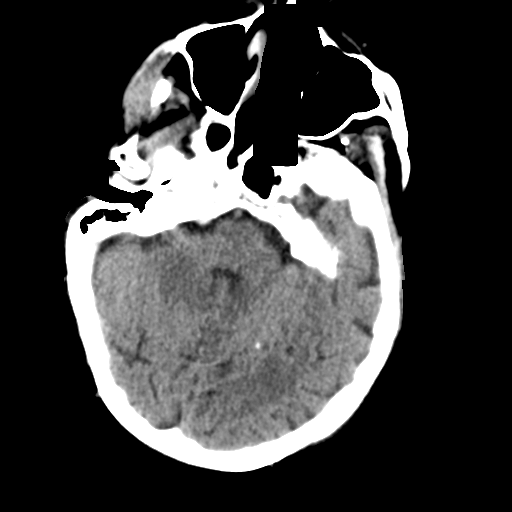
[im 22/33  brain]
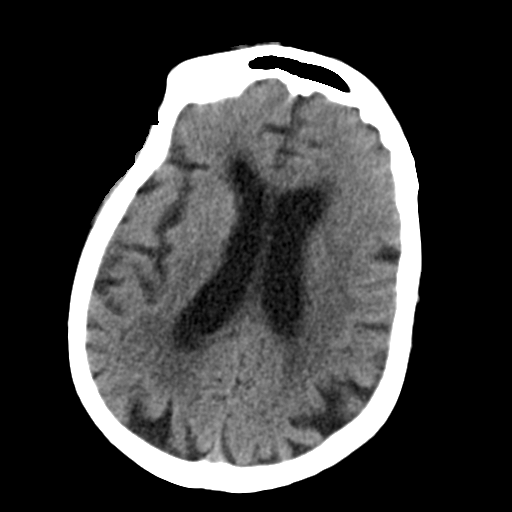

[Series 6: head 3.0 mpr cor · coronal · 0.32mm/px · 2 of 67 slices shown]
[im 10/67  brain]
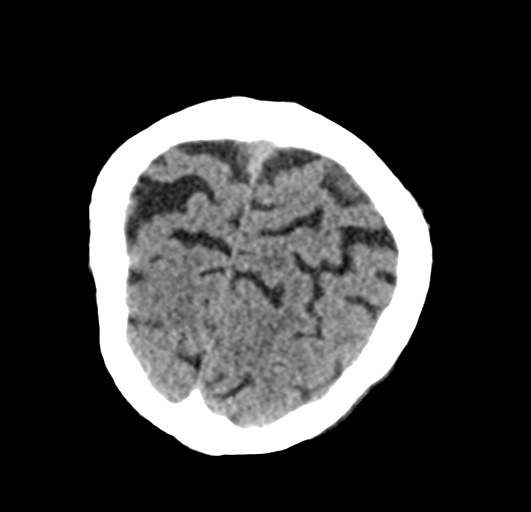
[im 20/67  brain]
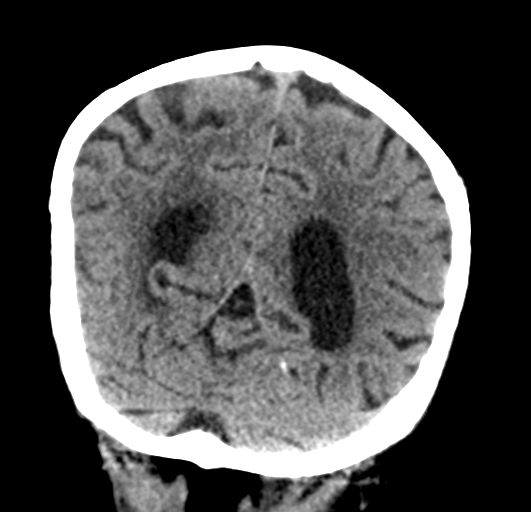

[Series 7: head 3.0 mpr sag · sagittal · 0.32mm/px · 1 of 64 slices shown]
[im 32/64  brain]
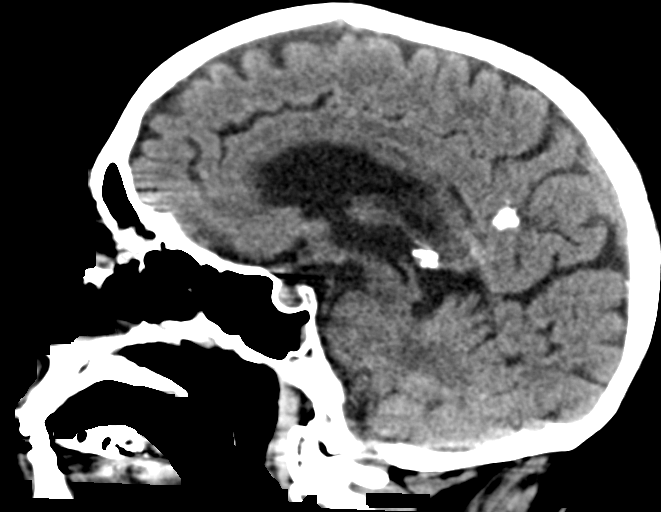

[Series 17: orthogonal axial st · axial · 0.21mm/px · z∈[+1086,+1247]mm · 10 of 92 slices shown, 13 images]
[im 9/92  brain]
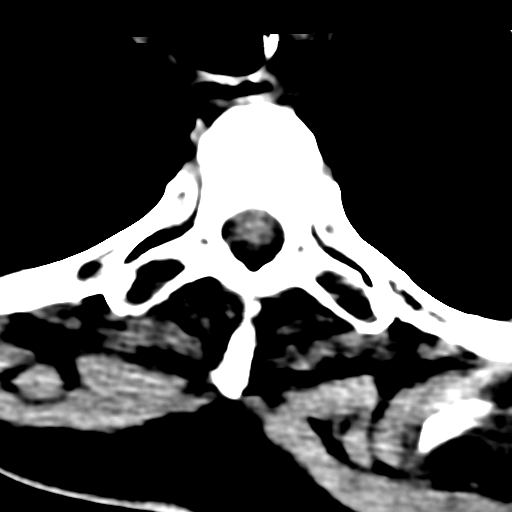
[im 9/92  bone]
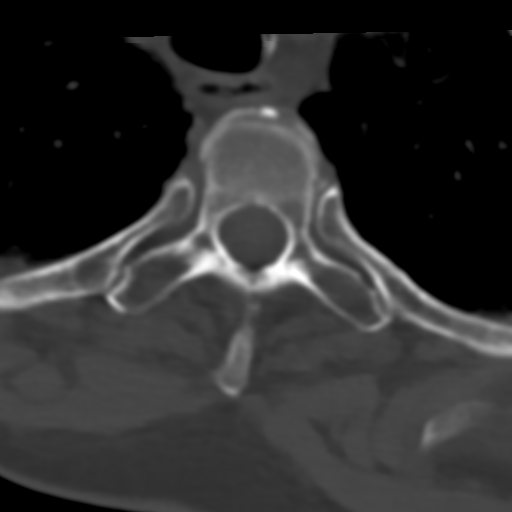
[im 17/92  brain]
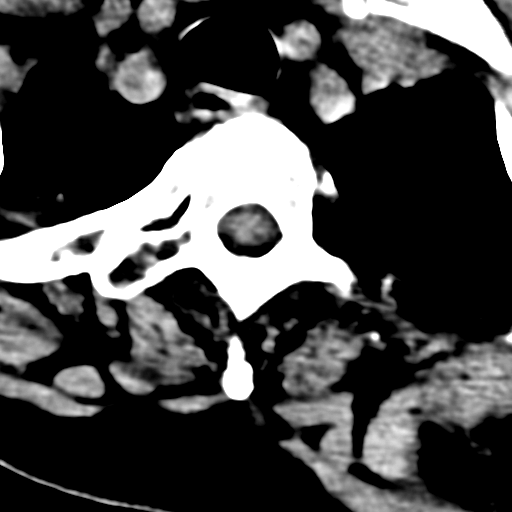
[im 25/92  brain]
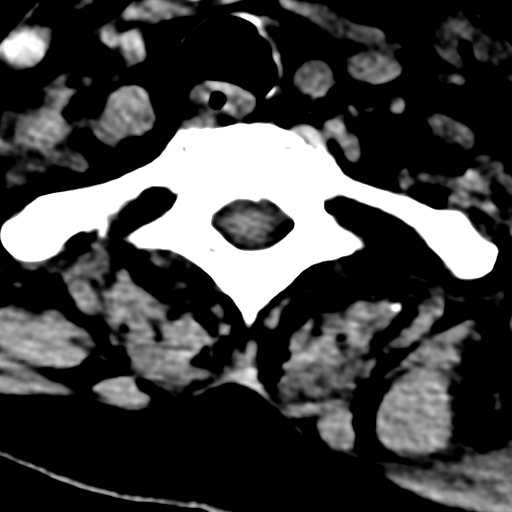
[im 34/92  brain]
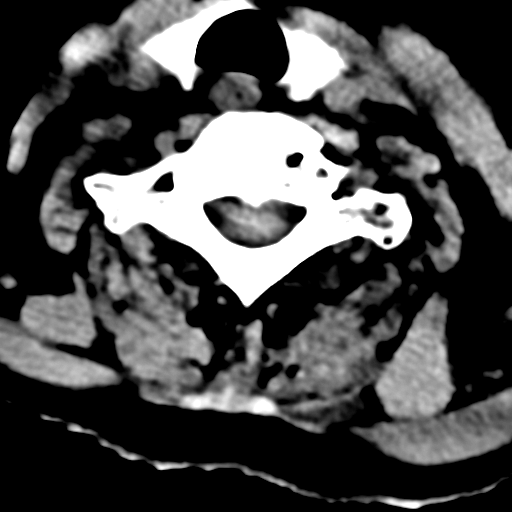
[im 42/92  brain]
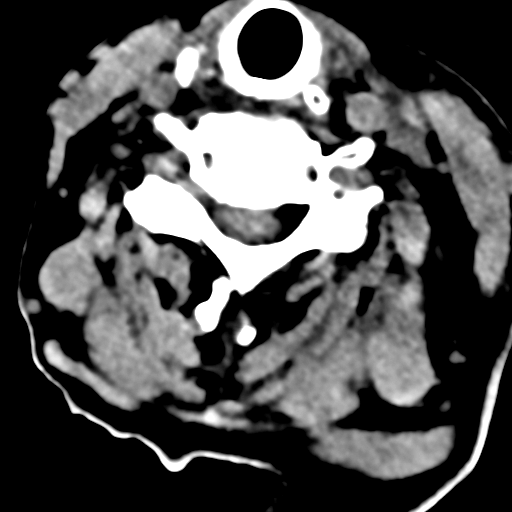
[im 42/92  bone]
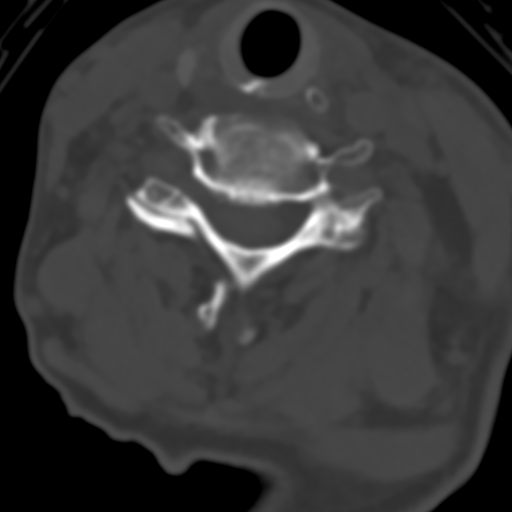
[im 50/92  brain]
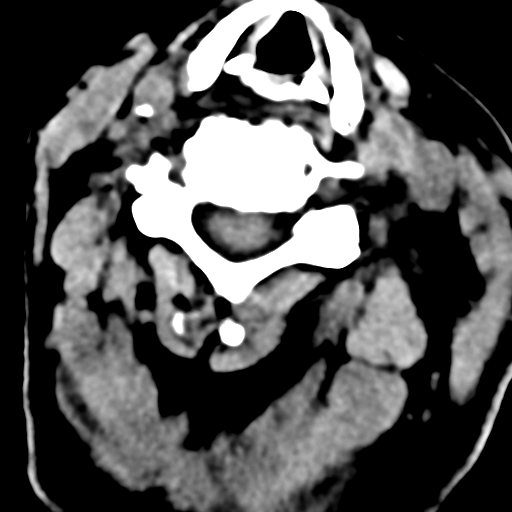
[im 58/92  brain]
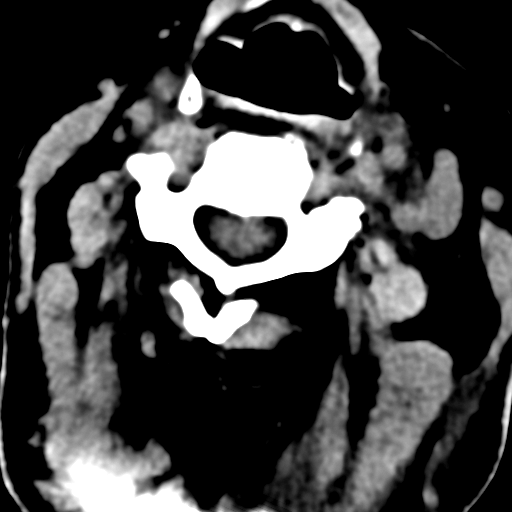
[im 67/92  brain]
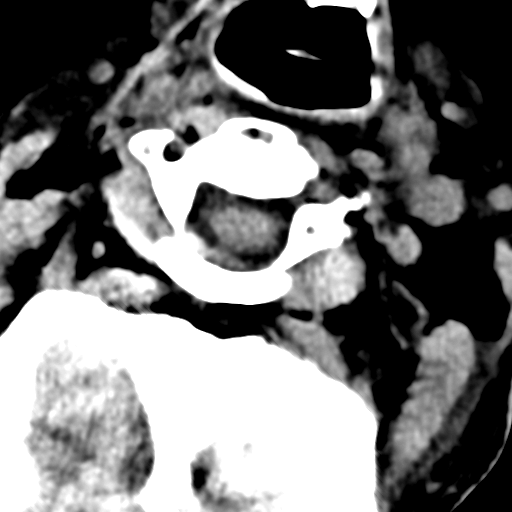
[im 75/92  brain]
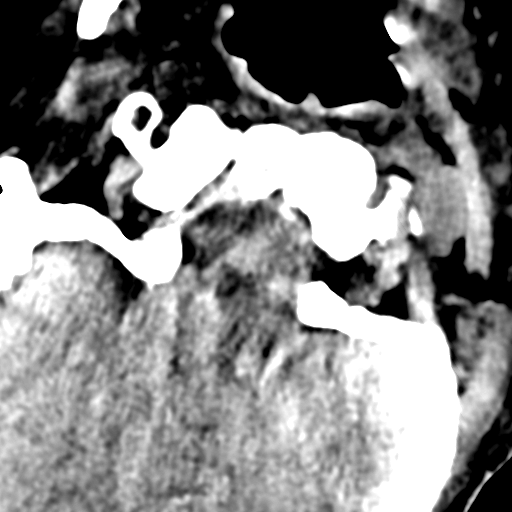
[im 75/92  bone]
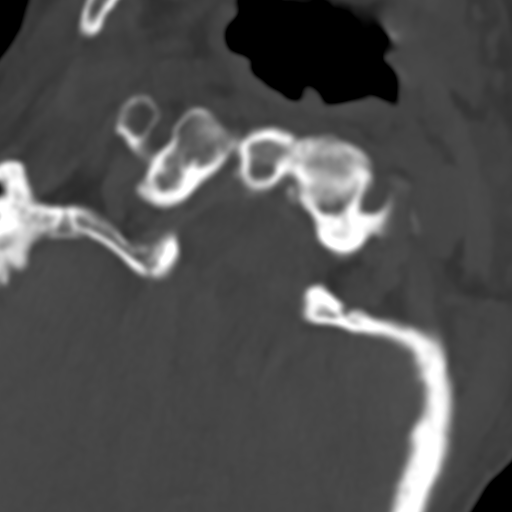
[im 83/92  brain]
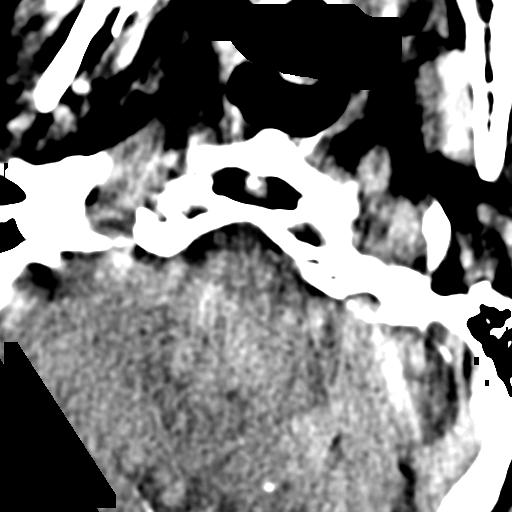

[15 of 47 positions shown; findings below may reference images not displayed]

FINDINGS: CT HEAD FINDINGS

Brain: No subdural, epidural, or subarachnoid hemorrhage. Ventricles
and sulci are prominent but stable. Cerebellum, brainstem, and basal
cisterns are normal. White matter changes identified. No acute
cortical ischemia or infarct noted. No mass effect or midline shift.

Vascular: No hyperdense vessel or unexpected calcification.

Skull: Normal. Negative for fracture or focal lesion.

Sinuses/Orbits: No acute finding.

Other: None.

CT CERVICAL SPINE FINDINGS

Alignment: Normal.

Skull base and vertebrae: No acute fracture. No primary bone lesion
or focal pathologic process.

Soft tissues and spinal canal: No prevertebral fluid or swelling. No
visible canal hematoma.

Disc levels:  Multilevel degenerative disc disease.

Upper chest: Negative.

Other: No other abnormalities.
IMPRESSION: 1. No acute intracranial abnormalities.
2. No fracture or traumatic malalignment in the cervical spine.
Degenerative changes.

## 2021-01-07 ENCOUNTER — Other Ambulatory Visit: Payer: Self-pay

## 2021-01-07 ENCOUNTER — Non-Acute Institutional Stay: Payer: Medicare Other | Admitting: Student

## 2021-01-07 DIAGNOSIS — Z515 Encounter for palliative care: Secondary | ICD-10-CM

## 2021-01-07 DIAGNOSIS — F0390 Unspecified dementia without behavioral disturbance: Secondary | ICD-10-CM

## 2021-01-07 DIAGNOSIS — E43 Unspecified severe protein-calorie malnutrition: Secondary | ICD-10-CM

## 2021-01-07 NOTE — Progress Notes (Signed)
[facility] Pall[facility] Consult Note Telephone: [phone]  Fax: [phone]    Date of encounter: 01/07/21 PATIENT NAME: Patricia Lynch [address] [place] [address]   [phone] (home)  DOB: 1928-08-10 MRN: [id number] PRIMARY CARE PROVIDER:    Dr. Keenan Bachelor  REFERRING PROVIDER:   Dr. Keenan Bachelor  RESPONSIBLE PARTY:    Contact Information    Name Relation Home Work [place] Daughter [phone]  [phone]       I met face to face with patient in the facility. Palliative Care was asked to follow this patient by consultation request of  Dr. Keenan Bachelor to address advance care planning and complex medical decision making. This is the initial visit.                                     ASSESSMENT AND PLAN / RECOMMENDATIONS:   Advance Care Planning/Goals of Care: Goals include to maximize quality of life and symptom management. Our advance care planning conversation included a discussion about:     The value and importance of advance care planning   Experiences with loved ones who have been seriously ill or have died   Exploration of personal, cultural or spiritual beliefs that might influence medical decisions   Exploration of goals of care in the event of a sudden injury or illness   MOST form in place  CODE STATUS: DNR   Patient with ongoing weight loss, protein calorie malnutrition despite current interventions. Awaiting to speak with patient's daughter regarding referral for hospice with diagnosis of protein calorie malnutrition. Palliative Medicine will continue to provide support.  Symptom Management/Plan:  Protein-calorie malnutrition-current weight of 79.3 pounds, BMI of 14. Albumin 3.2. Continue to assist with feeding, continue medpass nutritional supplement BID and magic cup TID, Prostat 41m BID. Continue mirtazapine 131mQHS.  Dementia-patient with cognitive decline per staff. Continue to assist with all adl's.  Reorient/redirect as needed. Monitor for falls/safety.   Follow up Palliative Care Visit: Palliative care will continue to follow for complex medical decision making, advance care planning, and clarification of goals. Return in 4 weeks or prn.  I spent 35 minutes providing this consultation. More than 50% of the time in this consultation was spent in counseling and care coordination.   PPS: 30%  HOSPICE ELIGIBILITY/DIAGNOSIS: TBD  Chief Complaint: Palliative Medicine initial visit; protein calorie malnutrition.  HISTORY OF PRESENT ILLNESS:  Patricia Lynch a [age].o. year old female  with dementia, muscle weakness, encephalopathy, abnormality of gait and mobility, abnormal posture, dysphagia, essential hypertension, hyperlipidemia, rhabdomyolysis.   Patient currently resides at [facility] since 2019. Staff report continued weight loss, impaired cognition, changes in behavior. Recent BIMS 12/2020 she scored a 3/15. She is occasionally out of bed. She is dependent for all adl's. Facility HHA reports patient "eating what she wants" and she likes milk. She is receiving Medpass BID and magic cup TID. Also receiving mirtazapine QHS. No recent falls. No recent ER visits or hospitalizations.   Patient received resting in bed, NAD. Pleasant affect. She is able to state her birthday, but otherwise unable to respond correctly to other orientation questions. Her posture is stooped, poor trunk support.   Labs on 01/01/2021:   CBC: wbc 5.3, rbc 3.21, hemoglobin 7.6, hct 24.4, MCV 76.1, MCH 23.8, RDW 21.0, platelets 368. CMP: glucose 110, calcium 8.7, BUN 16.5, creatinine 0.58, sodium  138, potassium 4.3, chloride 102, albumin 3.2, AST 60, ALT 34, eGFR 81.50.  History obtained from review of EMR, discussion with primary team, and interview with family, facility staff/caregiver and/or Patricia Lynch.  I reviewed available labs, medications, imaging, studies and related documents from the EMR.  Records  reviewed and summarized above.   ROS  Unable to contribute due to impaired cognition.  Physical Exam: Weight: 01/06/21 79.3 pounds, 12/16/20 80 pounds, 10/14/20 90.5 pounds Constitutional: NAD General: frail appearing, very thin EYES: anicteric sclera, lids intact, no discharge  ENMT: intact hearing, oral mucous membranes moist CV: S1S2, RRR, no LE edema Pulmonary: LCTA, no increased work of breathing, no cough, room air Abdomen: intake 100%, normo-active BS + 4 quadrants, soft and non tender, no ascites GU: deferred MSK: severe sarcopenia, non-ambulatory Skin: warm and dry, no rashes or wounds on visible skin Neuro: generalized weakness, A & O to person, forgetful Psych: non-anxious affect Hem/lymph/immuno: no widespread bruising   CURRENT PROBLEM LIST:  Patient Active Problem List   Diagnosis Date Noted  . Pressure injury of skin 07/10/2018  . Goals of care, counseling/discussion   . Palliative care by specialist   . Dementia ([place])   . Acute encephalopathy 07/08/2018  . Troponin level elevated 07/08/2018  . Hypothermia 07/08/2018  . Acute lower UTI 07/08/2018  . Non-traumatic rhabdomyolysis 05/26/2018  . Essential hypertension 05/26/2018  . Hyperlipidemia 05/26/2018  . Mild cognitive impairment 05/26/2018  . Renal insufficiency 05/26/2018  . Right arm pain 05/26/2018   PAST MEDICAL HISTORY:  Active Ambulatory Problems    Diagnosis Date Noted  . Non-traumatic rhabdomyolysis 05/26/2018  . Essential hypertension 05/26/2018  . Hyperlipidemia 05/26/2018  . Mild cognitive impairment 05/26/2018  . Renal insufficiency 05/26/2018  . Right arm pain 05/26/2018  . Acute encephalopathy 07/08/2018  . Troponin level elevated 07/08/2018  . Hypothermia 07/08/2018  . Acute lower UTI 07/08/2018  . Dementia ([place])   . Pressure injury of skin 07/10/2018  . Goals of care, counseling/discussion   . Palliative care by specialist    Resolved Ambulatory Problems    Diagnosis Date Noted   . No Resolved Ambulatory Problems   Past Medical History:  Diagnosis Date  . Hypertension    SOCIAL HX:  Social History   Tobacco Use  . Smoking status: Never Smoker  . Smokeless tobacco: Never Used  Substance Use Topics  . Alcohol use: Not on file   FAMILY HX:  Family History  Family history unknown: Yes    ALLERGIES:  Allergies  Allergen Reactions  . Beef-Derived Products Nausea Only  . Other Nausea Only    Some vegetables     PERTINENT MEDICATIONS:  Outpatient Encounter Medications as of 01/07/2021  Medication Sig  . acetaminophen (TYLENOL) 325 MG tablet Take 650 mg by mouth 3 (three) times daily.  Marland Kitchen acetaminophen (TYLENOL) 500 MG tablet Take 500 mg by mouth every 6 (six) hours as needed for moderate pain.   . Amino Acids-Protein Hydrolys (FEEDING SUPPLEMENT, PRO-STAT SUGAR FREE 64,) LIQD Take 30 mLs by mouth daily.  . collagenase (SANTYL) ointment Apply 1 application topically daily.  Marland Kitchen losartan (COZAAR) 50 MG tablet Take 50 mg by mouth at bedtime.  . Menthol, Topical Analgesic, (BIOFREEZE EX) Apply 1 application topically daily as needed (back pain).  . mupirocin ointment (BACTROBAN) 2 % Apply topically daily. Cleanse wounds to left anterior lower leg, left malleolus and left hip with NS and pat dry. Apply mupirocin ointment and cover with dry dressing daily. (Patient  not taking: Reported on 08/10/2018)   No facility-administered encounter medications on file as of 01/07/2021.    Thank you for the opportunity to participate in the care of Patricia Lynch.  The palliative care team will continue to follow. Please call our office at [phone] if we can be of additional assistance.   Ezekiel Slocumb, NP   COVID-19 PATIENT SCREENING TOOL Asked and negative response unless otherwise noted:   Have you had symptoms of covid, tested positive or been in contact with someone with symptoms/positive test in the past 5-10 days? No

## 2021-01-08 ENCOUNTER — Telehealth: Payer: Self-pay | Admitting: Student

## 2021-01-08 NOTE — Telephone Encounter (Signed)
Palliative NP spoke with daughter regarding patient weight loss, dementia. Explained palliative vs. Hospice services.  She is eligible for hospice services. Daughter is in agreement with Hospice. Patient continues to be a DNR. She would like for patient to remain at [facility]. SW at [place] updated.

## 2021-05-13 ENCOUNTER — Emergency Department (HOSPITAL_COMMUNITY): Payer: Medicare Other

## 2021-05-13 ENCOUNTER — Encounter (HOSPITAL_COMMUNITY): Payer: Self-pay

## 2021-05-13 ENCOUNTER — Other Ambulatory Visit: Payer: Self-pay

## 2021-05-13 ENCOUNTER — Emergency Department (HOSPITAL_COMMUNITY)
Admission: EM | Admit: 2021-05-13 | Discharge: 2021-05-13 | Disposition: A | Discharge status: Home / Self Care | Payer: Medicare Other | Attending: Emergency Medicine | Admitting: Emergency Medicine

## 2021-05-13 DIAGNOSIS — I10 Essential (primary) hypertension: Secondary | ICD-10-CM | POA: Diagnosis not present

## 2021-05-13 DIAGNOSIS — S0003XA Contusion of scalp, initial encounter: Secondary | ICD-10-CM

## 2021-05-13 DIAGNOSIS — W06XXXA Fall from bed, initial encounter: Secondary | ICD-10-CM | POA: Diagnosis not present

## 2021-05-13 DIAGNOSIS — S8001XA Contusion of right knee, initial encounter: Secondary | ICD-10-CM | POA: Diagnosis not present

## 2021-05-13 DIAGNOSIS — F0390 Unspecified dementia without behavioral disturbance: Secondary | ICD-10-CM | POA: Diagnosis not present

## 2021-05-13 DIAGNOSIS — S0990XA Unspecified injury of head, initial encounter: Secondary | ICD-10-CM | POA: Diagnosis present

## 2021-05-13 DIAGNOSIS — Z79899 Other long term (current) drug therapy: Secondary | ICD-10-CM | POA: Insufficient documentation

## 2021-05-13 DIAGNOSIS — S22019D Unspecified fracture of first thoracic vertebra, subsequent encounter for fracture with routine healing: Secondary | ICD-10-CM

## 2021-05-13 DIAGNOSIS — S0181XA Laceration without foreign body of other part of head, initial encounter: Secondary | ICD-10-CM | POA: Diagnosis not present

## 2021-05-13 DIAGNOSIS — S0101XA Laceration without foreign body of scalp, initial encounter: Secondary | ICD-10-CM

## 2021-05-13 NOTE — ED Triage Notes (Signed)
Patient arrives via EMS from [facility] after unwitnessed fall. Patient got up from bed and fell, was found on floor by bed by staff.  Facility reports multiple falls recently, including one earlier today. Pt has injury to right knee and abrasion on head. No blood thinners.  EMS vitals: BP 144/66 HR 82 CBG 164 SPO2 96%

## 2021-05-13 NOTE — ED Provider Notes (Addendum)
[facility]-EMERGENCY DEPT Provider Note   CSN: [id number] Arrival date & time: 05/13/21  0007     History Chief Complaint  Patient presents with   Patricia Lynch is a 87 y.o. female history of dementia, hypertension, hyperlipidemia who presenting with fall.  Patient is nonambulatory at baseline.  Patient apparently fell out of bed and was noted to have a abrasion of the scalp as well as right knee bruising.  Patient does not ambulate at baseline.  Patient unable to give much history due to dementia.  Patient's tetanus is updated in 2019  The history is provided by the EMS personnel.  Level V caveat- dementia     Past Medical History:  Diagnosis Date   Dementia (HCC)    Hyperlipidemia    Hypertension     Patient Active Problem List   Diagnosis Date Noted   Pressure injury of skin 07/10/2018   Goals of care, counseling/discussion    Palliative care by specialist    Dementia [facility])    Acute encephalopathy 07/08/2018   Troponin level elevated 07/08/2018   Hypothermia 07/08/2018   Acute lower UTI 07/08/2018   Non-traumatic rhabdomyolysis 05/26/2018   Essential hypertension 05/26/2018   Hyperlipidemia 05/26/2018   Mild cognitive impairment 05/26/2018   Renal insufficiency 05/26/2018   Right arm pain 05/26/2018    History reviewed. No pertinent surgical history.   OB History   No obstetric history on file.     Family History  Family history unknown: Yes    Social History   Tobacco Use   Smoking status: Never   Smokeless tobacco: Never    Home Medications Prior to Admission medications   Medication Sig Start Date End Date Taking? Authorizing Provider  acetaminophen (TYLENOL) 325 MG tablet Take 650 mg by mouth 3 (three) times daily.    [provider]  acetaminophen (TYLENOL) 500 MG tablet Take 500 mg by mouth every 6 (six) hours as needed for moderate pain.     [provider]  Amino Acids-Protein Hydrolys (FEEDING  SUPPLEMENT, PRO-STAT SUGAR FREE 64,) LIQD Take 30 mLs by mouth daily.    [provider]  collagenase (SANTYL) ointment Apply 1 application topically daily.    [provider]  losartan (COZAAR) 50 MG tablet Take 50 mg by mouth at bedtime. 04/14/18   [provider]  Menthol, Topical Analgesic, (BIOFREEZE EX) Apply 1 application topically daily as needed (back pain).    [provider]  mupirocin ointment (BACTROBAN) 2 % Apply topically daily. Cleanse wounds to left anterior lower leg, left malleolus and left hip with NS and pat dry. Apply mupirocin ointment and cover with dry dressing daily. Patient not taking: Reported on 08/10/2018 07/12/18   Cathren Harsh, MD    Allergies    Beef-derived products and Other  Review of Systems   Review of Systems  Skin:  Positive for wound.  All other systems reviewed and are negative.  Physical Exam Updated Vital Signs BP 114/81   Pulse 80   Temp (!) 97.5 F (36.4 C) (Oral)   Resp 17   SpO2 98%   Physical Exam Vitals and nursing note reviewed.  Constitutional:      Comments: Demented  HENT:     Head:     Comments: 1 cm laceration on the forehead with some dried blood    Mouth/Throat:     Mouth: Mucous membranes are moist.  Eyes:     Extraocular Movements:  Extraocular movements intact.     Pupils: Pupils are equal, round, and reactive to light.  Cardiovascular:     Rate and Rhythm: Normal rate and regular rhythm.     Pulses: Normal pulses.     Heart sounds: Normal heart sounds.  Pulmonary:     Effort: Pulmonary effort is normal.     Breath sounds: Normal breath sounds.  Abdominal:     General: Abdomen is flat.     Palpations: Abdomen is soft.  Musculoskeletal:     Cervical back: Normal range of motion and neck supple.     Comments: No obvious spinal tenderness.  Patient is able to range bilateral hips.  Patient is contracted at baseline.  Patient does have some bruising of the right knee.  Skin:     General: Skin is warm.     Capillary Refill: Capillary refill takes less than 2 seconds.  Neurological:     Comments: Demented and confused which is baseline  Psychiatric:     Comments: unable    ED Results / Procedures / Treatments   Labs (all labs ordered are listed, but only abnormal results are displayed) Labs Reviewed - No data to display  EKG None  Radiology DG Chest 1 View  Result Date: 05/13/2021 CLINICAL DATA:  Unwitnessed fall. EXAM: CHEST  1 VIEW COMPARISON:  July 08, 2018 FINDINGS: There is limited evaluation of the superior mediastinum secondary to positioning of the patient's head and neck. The heart size and mediastinal contours are within normal limits. There is mild to moderate severity calcification of the aortic arch. Both lungs are clear. Multilevel degenerative changes seen throughout the thoracic spine. IMPRESSION: Limited study without evidence of acute or active cardiopulmonary disease. Electronically Signed   By: Aram Candela M.D.   On: 05/13/2021 02:14   DG Pelvis 1-2 Views  Result Date: 05/13/2021 CLINICAL DATA:  Unwitnessed fall. EXAM: PELVIS - 1-2 VIEW COMPARISON:  None. FINDINGS: There is no evidence of pelvic fracture or diastasis. No pelvic bone lesions are seen. Degenerative changes seen involving both hips in the form of joint space narrowing and acetabular sclerosis. Moderate severity levoscoliosis of the lumbar spine is also noted with multilevel degenerative changes. A large stool burden is seen. IMPRESSION: 1. No acute osseous abnormality. 2. Advanced degenerative changes of the lumbar spine and bilateral hips. Electronically Signed   By: Aram Candela M.D.   On: 05/13/2021 02:13   CT HEAD WO CONTRAST ( )  Result Date: 05/13/2021 CLINICAL DATA:  Unwitnessed fall. EXAM: CT HEAD WITHOUT CONTRAST TECHNIQUE: Contiguous axial images were obtained from the base of the skull through the vertex without intravenous contrast. COMPARISON:  July 08, 2018 FINDINGS: Brain: There is moderate severity cerebral atrophy with widening of the extra-axial spaces and ventricular dilatation. There are areas of decreased attenuation within the white matter tracts of the supratentorial brain, consistent with microvascular disease changes. Vascular: No hyperdense vessel or unexpected calcification. Skull: Normal. Negative for fracture or focal lesion. Sinuses/Orbits: No acute finding. Other: There is mild right frontal scalp soft tissue swelling. IMPRESSION: 1. Mild right frontal scalp soft tissue swelling without evidence of an acute fracture or acute intracranial abnormality. 2. Moderate severity cerebral atrophy and microvascular disease changes of the supratentorial brain. Electronically Signed   By: Aram Candela M.D.   On: 05/13/2021 02:34   CT Cervical Spine Wo Contrast  Result Date: 05/13/2021 CLINICAL DATA:  Unwitnessed fall. EXAM: CT CERVICAL SPINE WITHOUT CONTRAST TECHNIQUE: Multidetector CT imaging of  the cervical spine was performed without intravenous contrast. Multiplanar CT image reconstructions were also generated. COMPARISON:  May 26, 2018 FINDINGS: Alignment: Normal. Skull base and vertebrae: No acute cervical spine fracture. Ill-defined deformities and associated sclerotic changes of indeterminate age are seen involving the superior endplates of the T1 and T2 vertebral bodies. These are new findings when compared to the prior study. Soft tissues and spinal canal: No prevertebral fluid or swelling. No visible canal hematoma. Disc levels: Moderate severity endplate sclerosis is seen at the levels of C4-C5, C5-C6, C6-C7. Marked severity intervertebral disc space narrowing is seen at the levels of C3-C4, C4-C5, C5-C6 and C6-C7. Bilateral moderate severity multilevel facet joint hypertrophy is noted. Upper chest: Stable approximately 1/2 shaft width anterior displacement of the proximal right clavicle is noted with respect to the sternum. Other:  None. IMPRESSION: 1. Ill-defined deformities of indeterminate age involving the superior endplates of the T1 and T2 vertebral bodies. These are new findings when compared to the prior study. Correlation with follow-up thoracic spine MRI is recommended to exclude subacute fractures. 2. Marked severity multilevel degenerative disc disease and facet joint hypertrophy. 3. Stable approximately 1/2 shaft width anterior displacement of the proximal right clavicle with respect to the sternum. Electronically Signed   By: Aram Candela M.D.   On: 05/13/2021 02:43   DG Knee Complete 4 Views Right  Result Date: 05/13/2021 CLINICAL DATA:  Unwitnessed fall. EXAM: RIGHT KNEE - COMPLETE 4+ VIEW COMPARISON:  Right tibia and right fibula plain films, dated July 08, 2018. FINDINGS: No evidence of an acute fracture or dislocation. The visualized portion of the right femoral shaft is diffusely sclerotic in appearance. This is stable in appearance when compared to the visualized portion of the right femur on the prior right tibia and right fibula plain films. Moderate severity medial and lateral tibiofemoral compartment space narrowing is seen. Patellofemoral narrowing is also noted. Mild to moderate severity medial and lateral chondrocalcinosis is also present. A small joint effusion is seen. IMPRESSION: 1. No evidence of an acute fracture or dislocation. 2. Stable sclerosis throughout the medullary portion of the mid and distal right femoral shaft which may represent sequelae associated with large enchondroma. Electronically Signed   By: Aram Candela M.D.   On: 05/13/2021 02:21    Procedures Procedures   LACERATION REPAIR Performed by: Richardean Canal Authorized by: Richardean Canal Consent: Verbal consent obtained. Risks and benefits: risks, benefits and alternatives were discussed Consent given by: patient Patient identity confirmed: provided demographic data Prepped and Draped in normal sterile fashion Wound  explored  Laceration Location: forehead  Laceration Length: 1 cm  No Foreign Bodies seen or palpated  Anesthesia: local infiltration  Local anesthetic: none   Irrigation method: syringe Amount of cleaning: standard  Skin closure: dermabond    Patient tolerance: Patient tolerated the procedure well with no immediate complications.   Medications Ordered in ED Medications - No data to display  ED Course  I have reviewed the triage vital signs and the nursing notes.  Pertinent labs & imaging results that were available during my care of the patient were reviewed by me and considered in my medical decision making (see chart for details).    MDM Rules/Calculators/A&P                           Tru Leopard is a 87 y.o. female here presenting with fall.  Patient is not ambulatory at baseline.  Patient does have obvious scalp hematoma.  Patient has a small laceration of the scalp that I applied Dermabond to.  CT head and cervical spine unremarkable.  Of note, there is an incidental T1 and T2 endplate fractures that are subacute.  Patient has no obvious bony tenderness or signs of trauma in the thoracic spine.  Patient is also nonambulatory at baseline.  No obvious pelvic fracture or knee fracture.  At this point, patient is stable to discharge back to facility.  Patient is also on hospice.  Final Clinical Impression(s) / ED Diagnoses Final diagnoses:  None    Rx / DC Orders ED Discharge Orders     None        Charlynne Pander, MD 05/13/21 [id number]    Charlynne Pander, MD 05/13/21 [phone]

## 2021-05-13 NOTE — ED Notes (Signed)
[facility] contacted, transport arranged for patient.  

## 2021-05-13 NOTE — Discharge Instructions (Addendum)
Your scans today did not show any bleeding in your brain. There is a suggestion of previous fractures of your thoracic spine.  Due to your age I recommend that you just take Tylenol for pain.  She can follow-up with neurosurgery if she has severe back pain  Fall precautions at the facility  I applied Dermabond to your scalp laceration to help with bleeding  Return to ER if you have another fall, lethargy, uncontrolled pain

## 2021-06-15 ENCOUNTER — Encounter (HOSPITAL_COMMUNITY): Payer: Self-pay | Admitting: Radiology
